# Patient Record
Sex: Female | Born: 1955 | Race: White | Hispanic: No | Smoking: Never smoker
Health system: Southern US, Community
[De-identification: ages and names within clinical notes are randomized; demographics above are authoritative.]

## PROBLEM LIST (undated history)

## (undated) DIAGNOSIS — F32A Depression, unspecified: Secondary | ICD-10-CM

## (undated) DIAGNOSIS — K219 Gastro-esophageal reflux disease without esophagitis: Secondary | ICD-10-CM

## (undated) DIAGNOSIS — F329 Major depressive disorder, single episode, unspecified: Secondary | ICD-10-CM

## (undated) DIAGNOSIS — F419 Anxiety disorder, unspecified: Secondary | ICD-10-CM

## (undated) DIAGNOSIS — Z79890 Hormone replacement therapy (postmenopausal): Secondary | ICD-10-CM

## (undated) DIAGNOSIS — G4730 Sleep apnea, unspecified: Secondary | ICD-10-CM

## (undated) HISTORY — DX: Anxiety disorder, unspecified: F41.9

## (undated) HISTORY — DX: Sleep apnea, unspecified: G47.30

## (undated) HISTORY — DX: Depression, unspecified: F32.A

## (undated) HISTORY — PX: CHOLECYSTECTOMY: SHX55

## (undated) HISTORY — DX: Hormone replacement therapy: Z79.890

## (undated) HISTORY — DX: Major depressive disorder, single episode, unspecified: F32.9

## (undated) HISTORY — DX: Gastro-esophageal reflux disease without esophagitis: K21.9

---

## 1999-10-17 ENCOUNTER — Encounter: Admission: RE | Admit: 1999-10-17 | Discharge: 1999-10-17 | Payer: Self-pay | Admitting: Family Medicine

## 1999-10-17 ENCOUNTER — Encounter: Payer: Self-pay | Admitting: Family Medicine

## 2001-10-15 DIAGNOSIS — Z79890 Hormone replacement therapy (postmenopausal): Secondary | ICD-10-CM

## 2001-10-15 HISTORY — DX: Hormone replacement therapy: Z79.890

## 2002-01-28 ENCOUNTER — Encounter: Payer: Self-pay | Admitting: *Deleted

## 2002-01-28 ENCOUNTER — Ambulatory Visit (HOSPITAL_COMMUNITY): Admission: RE | Admit: 2002-01-28 | Discharge: 2002-01-28 | Payer: Self-pay | Admitting: *Deleted

## 2002-02-19 ENCOUNTER — Encounter: Payer: Self-pay | Admitting: Gastroenterology

## 2002-02-19 ENCOUNTER — Encounter: Admission: RE | Admit: 2002-02-19 | Discharge: 2002-02-19 | Payer: Self-pay | Admitting: Gastroenterology

## 2002-09-07 ENCOUNTER — Encounter: Payer: Self-pay | Admitting: Family Medicine

## 2002-09-07 ENCOUNTER — Encounter: Admission: RE | Admit: 2002-09-07 | Discharge: 2002-09-07 | Payer: Self-pay | Admitting: Family Medicine

## 2003-09-15 ENCOUNTER — Encounter: Admission: RE | Admit: 2003-09-15 | Discharge: 2003-09-15 | Payer: Self-pay | Admitting: Family Medicine

## 2003-12-02 ENCOUNTER — Other Ambulatory Visit: Admission: RE | Admit: 2003-12-02 | Discharge: 2003-12-02 | Payer: Self-pay | Admitting: Family Medicine

## 2004-06-26 ENCOUNTER — Other Ambulatory Visit: Admission: RE | Admit: 2004-06-26 | Discharge: 2004-06-26 | Payer: Self-pay | Admitting: Family Medicine

## 2004-09-19 ENCOUNTER — Encounter: Admission: RE | Admit: 2004-09-19 | Discharge: 2004-09-19 | Payer: Self-pay | Admitting: Family Medicine

## 2004-12-11 ENCOUNTER — Ambulatory Visit: Payer: Self-pay | Admitting: Internal Medicine

## 2004-12-11 ENCOUNTER — Ambulatory Visit (HOSPITAL_BASED_OUTPATIENT_CLINIC_OR_DEPARTMENT_OTHER): Admission: RE | Admit: 2004-12-11 | Discharge: 2004-12-11 | Payer: Self-pay | Admitting: Family Medicine

## 2005-01-17 ENCOUNTER — Other Ambulatory Visit: Admission: RE | Admit: 2005-01-17 | Discharge: 2005-01-17 | Payer: Self-pay | Admitting: Family Medicine

## 2005-08-01 ENCOUNTER — Other Ambulatory Visit: Admission: RE | Admit: 2005-08-01 | Discharge: 2005-08-01 | Payer: Self-pay | Admitting: Family Medicine

## 2005-10-11 ENCOUNTER — Encounter: Admission: RE | Admit: 2005-10-11 | Discharge: 2005-10-11 | Payer: Self-pay | Admitting: Family Medicine

## 2006-01-28 ENCOUNTER — Other Ambulatory Visit: Admission: RE | Admit: 2006-01-28 | Discharge: 2006-01-28 | Payer: Self-pay | Admitting: Family Medicine

## 2006-10-14 ENCOUNTER — Encounter: Admission: RE | Admit: 2006-10-14 | Discharge: 2006-10-14 | Payer: Self-pay | Admitting: Family Medicine

## 2007-02-07 ENCOUNTER — Other Ambulatory Visit: Admission: RE | Admit: 2007-02-07 | Discharge: 2007-02-07 | Payer: Self-pay | Admitting: Family Medicine

## 2007-03-12 ENCOUNTER — Ambulatory Visit (HOSPITAL_BASED_OUTPATIENT_CLINIC_OR_DEPARTMENT_OTHER): Admission: RE | Admit: 2007-03-12 | Discharge: 2007-03-12 | Payer: Self-pay | Admitting: Family Medicine

## 2007-03-23 ENCOUNTER — Ambulatory Visit: Payer: Self-pay | Admitting: Internal Medicine

## 2007-08-12 ENCOUNTER — Encounter: Admission: RE | Admit: 2007-08-12 | Discharge: 2007-08-12 | Payer: Self-pay | Admitting: Internal Medicine

## 2007-10-21 ENCOUNTER — Encounter: Admission: RE | Admit: 2007-10-21 | Discharge: 2007-10-21 | Payer: Self-pay | Admitting: Family Medicine

## 2008-10-25 ENCOUNTER — Ambulatory Visit (HOSPITAL_BASED_OUTPATIENT_CLINIC_OR_DEPARTMENT_OTHER): Admission: RE | Admit: 2008-10-25 | Discharge: 2008-10-25 | Payer: Self-pay | Admitting: Family Medicine

## 2008-10-25 ENCOUNTER — Ambulatory Visit: Payer: Self-pay | Admitting: Diagnostic Radiology

## 2009-02-08 ENCOUNTER — Ambulatory Visit: Payer: Self-pay | Admitting: Family Medicine

## 2009-02-08 DIAGNOSIS — F4322 Adjustment disorder with anxiety: Secondary | ICD-10-CM | POA: Insufficient documentation

## 2009-02-08 DIAGNOSIS — K219 Gastro-esophageal reflux disease without esophagitis: Secondary | ICD-10-CM | POA: Insufficient documentation

## 2009-02-09 ENCOUNTER — Encounter: Payer: Self-pay | Admitting: Family Medicine

## 2009-02-10 ENCOUNTER — Telehealth: Payer: Self-pay | Admitting: Family Medicine

## 2009-03-08 ENCOUNTER — Ambulatory Visit: Payer: Self-pay | Admitting: Family Medicine

## 2009-05-27 ENCOUNTER — Encounter: Payer: Self-pay | Admitting: Family Medicine

## 2009-06-17 ENCOUNTER — Encounter: Payer: Self-pay | Admitting: Family Medicine

## 2009-07-20 ENCOUNTER — Telehealth (INDEPENDENT_AMBULATORY_CARE_PROVIDER_SITE_OTHER): Payer: Self-pay | Admitting: *Deleted

## 2009-10-27 ENCOUNTER — Ambulatory Visit: Payer: Self-pay | Admitting: Diagnostic Radiology

## 2009-10-27 ENCOUNTER — Ambulatory Visit (HOSPITAL_BASED_OUTPATIENT_CLINIC_OR_DEPARTMENT_OTHER): Admission: RE | Admit: 2009-10-27 | Discharge: 2009-10-27 | Payer: Self-pay | Admitting: Family Medicine

## 2009-11-02 ENCOUNTER — Encounter: Admission: RE | Admit: 2009-11-02 | Discharge: 2009-11-02 | Payer: Self-pay | Admitting: Family Medicine

## 2010-02-21 ENCOUNTER — Ambulatory Visit: Payer: Self-pay | Admitting: Family

## 2010-02-21 ENCOUNTER — Encounter (INDEPENDENT_AMBULATORY_CARE_PROVIDER_SITE_OTHER): Payer: Self-pay | Admitting: *Deleted

## 2010-02-21 DIAGNOSIS — G4733 Obstructive sleep apnea (adult) (pediatric): Secondary | ICD-10-CM | POA: Insufficient documentation

## 2010-02-21 DIAGNOSIS — R0789 Other chest pain: Secondary | ICD-10-CM | POA: Insufficient documentation

## 2010-02-21 DIAGNOSIS — F329 Major depressive disorder, single episode, unspecified: Secondary | ICD-10-CM | POA: Insufficient documentation

## 2010-02-21 DIAGNOSIS — F411 Generalized anxiety disorder: Secondary | ICD-10-CM | POA: Insufficient documentation

## 2010-02-21 DIAGNOSIS — R0602 Shortness of breath: Secondary | ICD-10-CM | POA: Insufficient documentation

## 2010-02-22 ENCOUNTER — Encounter: Payer: Self-pay | Admitting: Family

## 2010-03-02 ENCOUNTER — Encounter (INDEPENDENT_AMBULATORY_CARE_PROVIDER_SITE_OTHER): Payer: Self-pay | Admitting: *Deleted

## 2010-03-07 ENCOUNTER — Ambulatory Visit: Payer: Self-pay | Admitting: Gastroenterology

## 2010-03-17 ENCOUNTER — Ambulatory Visit: Payer: Self-pay | Admitting: Gastroenterology

## 2010-03-21 ENCOUNTER — Encounter: Payer: Self-pay | Admitting: Gastroenterology

## 2010-03-22 ENCOUNTER — Encounter: Payer: Self-pay | Admitting: Family

## 2010-03-29 ENCOUNTER — Encounter: Payer: Self-pay | Admitting: Cardiology

## 2010-03-29 ENCOUNTER — Ambulatory Visit: Payer: Self-pay | Admitting: Cardiology

## 2010-04-11 ENCOUNTER — Ambulatory Visit: Payer: Self-pay | Admitting: Pulmonary Disease

## 2010-04-12 ENCOUNTER — Ambulatory Visit: Payer: Self-pay | Admitting: Family

## 2010-04-13 ENCOUNTER — Telehealth: Payer: Self-pay | Admitting: Family

## 2010-04-24 ENCOUNTER — Ambulatory Visit: Payer: Self-pay | Admitting: Cardiology

## 2010-04-24 ENCOUNTER — Ambulatory Visit: Payer: Self-pay

## 2010-04-24 ENCOUNTER — Ambulatory Visit (HOSPITAL_COMMUNITY)
Admission: RE | Admit: 2010-04-24 | Discharge: 2010-04-24 | Payer: Self-pay | Source: Home / Self Care | Admitting: Cardiology

## 2010-04-24 ENCOUNTER — Encounter: Payer: Self-pay | Admitting: Cardiology

## 2010-05-30 ENCOUNTER — Ambulatory Visit: Payer: Self-pay | Admitting: Pulmonary Disease

## 2010-10-27 ENCOUNTER — Telehealth: Payer: Self-pay | Admitting: Family

## 2010-11-01 ENCOUNTER — Ambulatory Visit (HOSPITAL_BASED_OUTPATIENT_CLINIC_OR_DEPARTMENT_OTHER)
Admission: RE | Admit: 2010-11-01 | Discharge: 2010-11-01 | Payer: Self-pay | Source: Home / Self Care | Attending: Gynecology | Admitting: Gynecology

## 2010-11-07 ENCOUNTER — Ambulatory Visit
Admission: RE | Admit: 2010-11-07 | Discharge: 2010-11-07 | Payer: Self-pay | Source: Home / Self Care | Attending: Family | Admitting: Family

## 2010-11-08 ENCOUNTER — Encounter
Admission: RE | Admit: 2010-11-08 | Discharge: 2010-11-08 | Payer: Self-pay | Source: Home / Self Care | Attending: Gynecology | Admitting: Gynecology

## 2010-11-12 LAB — CONVERTED CEMR LAB
Albumin: 3.8 g/dL (ref 3.5–5.2)
Albumin: 4.4 g/dL (ref 3.5–5.2)
Basophils Absolute: 0 10*3/uL (ref 0.0–0.1)
Basophils Relative: 0 % (ref 0–1)
Calcium: 8.9 mg/dL (ref 8.4–10.5)
Calcium: 9.1 mg/dL (ref 8.4–10.5)
Chloride: 105 meq/L (ref 96–112)
Chloride: 111 meq/L (ref 96–112)
Direct LDL: 133.6 mg/dL
HCT: 37.9 % (ref 36.0–46.0)
HCT: 40.9 % (ref 36.0–46.0)
Hemoglobin: 12.7 g/dL (ref 12.0–15.0)
Hemoglobin: 13 g/dL (ref 12.0–15.0)
MCV: 89.9 fL (ref 78.0–100.0)
Monocytes Absolute: 0.4 10*3/uL (ref 0.1–1.0)
Monocytes Relative: 7 % (ref 3–12)
Neutro Abs: 3.1 10*3/uL (ref 1.7–7.7)
Neutrophils Relative %: 55 % (ref 43–77)
Neutrophils Relative %: 56.7 % (ref 43.0–77.0)
Platelets: 186 10*3/uL (ref 150.0–400.0)
Potassium: 3.9 meq/L (ref 3.5–5.1)
Potassium: 4.2 meq/L (ref 3.5–5.3)
RBC: 4.55 M/uL (ref 3.87–5.11)
RDW: 12 % (ref 11.5–14.6)
Sodium: 141 meq/L (ref 135–145)
Sodium: 142 meq/L (ref 135–145)
TSH: 1.82 microintl units/mL (ref 0.35–5.50)
Total Bilirubin: 0.5 mg/dL (ref 0.3–1.2)
Total CHOL/HDL Ratio: 4
Total Protein: 6.7 g/dL (ref 6.0–8.3)
Total Protein: 7.2 g/dL (ref 6.0–8.3)
Triglycerides: 96 mg/dL (ref 0.0–149.0)
VLDL: 24 mg/dL (ref 0–40)
WBC: 4.9 10*3/uL (ref 4.5–10.5)
WBC: 5.5 10*3/uL (ref 4.0–10.5)

## 2010-11-14 NOTE — Assessment & Plan Note (Signed)
Summary: cpx pap pt fasting cx125 blood test requested/mhf   Vital Signs:  Patient profile:   55 year old female Height:      67.6 inches Weight:      178.25 pounds BMI:     27.52 Temp:     98.1 degrees F oral Pulse rate:   72 / minute Pulse rhythm:   regular Resp:     16 per minute BP sitting:   118 / 80  (right arm) Cuff size:   regular  Vitals Entered By: Mervin Kung CMA (Feb 21, 2010 8:48 AM) CC: room 5  Physical. Is Patient Diabetic? No Pain Assessment Patient in pain? no        Primary Care Provider:  Seymour Bars DO  CC:  room 5  Physical..  History of Present Illness: Angel Arellano is a 55 year old female who presents today for a complete physical and to address some ongoing medical concerns.  1) Preventative- Sees Dr. Nicholas Lose (GYN)   LMP 5 years ago-  Dr. Nicholas Lose has been managing HRT.  Diet- reports that diet is fair.  Patietn has gained 40 lbs in last 4 years which she attributes to depression, lack of exercise and increased hours at work. Last tetanus was less than 10 yrs ago per patient, she will call with date.   2) Depression/Anxiety- using alprazolam 3x weekly (being prescibed by Dr. Nicholas Lose).  Notes current severity of depression is 6/10- approached "10" earlier this year. She discontinued citalopram, but notes that she did feel better when she was taking this medication and did not require PRN benzos.  She and her husband have been having marital problems which is contributing to her stress.  Pt reports that they are seeing a therapist together which is helping tremendously.  3) OSA- Patient reports difficulty sleeping due to OSA.  Has CPAP but has difficulty tolerating and as a result has not been wearing regularly.  Patient was being seen by Dr. Laurann Montana at [place].    4) Chest pressure-  R sided, + SOB chronic.  Noticed more recently. + chest pain with exertion.      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  2 grown daughters. One in [place] and one in [place]. No exercise.  Fair diet.  Trying to lose weight. Nonsmoker. Several glasses of wine/beer per week.Smoking Status:  never Caffeine use/day:  1 cup daily Does Patient Exercise:  no  Review of Systems       Constitutional: Denies Fever ENT:  Difficulty breathing through right nare, denies sore throat. Resp: Denies cough CV:  Chest pain "all the time" x last 1 month.  SOB "all the time" x 2 years GI:  Denies nausea or vomitting GU: Denies dysuria Lymphatic: Denies lymphadenopathy Musculoskeletal:  Denies muscle/joint pain Skin:  Denies Rashes Psychiatric: depression see HPI, occasional anxiety at work Neuro: occasional numbness in fingers bilaterally, no weakness     Physical Exam  General:  Well-developed,well-nourished,in no acute distress; alert,appropriate and cooperative throughout  examination Head:  Normocephalic and atraumatic without obvious abnormalities. No apparent alopecia or balding. Eyes:  No corneal or conjunctival inflammation noted. EOMI. Perrla. Funduscopic exam benign, without hemorrhages, exudates or papilledema. Vision grossly normal. Ears:  External ear exam shows no significant lesions or deformities.  Otoscopic examination reveals clear canals, tympanic membranes are intact bilaterally without bulging, retraction, inflammation or discharge. Hearing is grossly normal bilaterally. Nose:  No clear sign of deviated septum Mouth:  Oral mucosa and oropharynx without lesions or exudates.  Teeth in good  repair. Neck:  No deformities, masses, or tenderness noted. Chest Wall:  No deformities, masses, or tenderness noted. Breasts:  No mass, nodules, thickening, tenderness, bulging, retraction, inflamation, nipple discharge or skin changes noted.   Lungs:  Normal respiratory effort, chest expands symmetrically. Lungs are clear to auscultation, no crackles or wheezes. Heart:  Normal rate and regular rhythm. S1 and S2 normal without gallop, murmur, click, rub or other extra sounds. Abdomen:  Bowel sounds positive,abdomen soft and non-tender without masses, organomegaly or hernias noted. Genitalia:  deferred to GYN Msk:  No deformity or scoliosis noted of thoracic or lumbar spine.   Pulses:  posterior tibial pulses are full and equal bilaterallyR radial normal, R dorsalis pedis normal, L radial normal, and L dorsalis pedis normal.   Extremities:  No clubbing, cyanosis, edema, or deformity noted with normal full range of motion of all joints.   Neurologic:  No cranial nerve deficits noted. Station and gait are normal. Plantar reflexes are down-going bilaterally. DTRs are symmetrical throughout. Sensory, motor and coordinative functions appear intact. Skin:  Intact without suspicious lesions or rashes Cervical Nodes:  No lymphadenopathy noted Axillary Nodes:  No palpable lymphadenopathy Psych:  Pleasant, good eye contact, A and O x 3- tearful at times.   Impression & Recommendations:  Problem # 1:  PREVENTIVE HEALTH CARE (ICD-V70.0) Assessment Comment Only Check fasting labs.  Immunizations reviewed and up to date.  Patient counseled on diet, exercise and weight loss.  Up to date on mammogram.  Needs screening colo, will refer to [facility].  Pt instructed to follow up with GYN for HRT and annual pap.  EKG NSR Orders: T-Comprehensive Metabolic Panel [id number]) T-CBC w/Diff ([id number]) T-TSH [id number]) T-Lipid Profile ([id number]) Gastroenterology Referral (GI) EKG w/ Interpretation  (93000)  Problem # 2:  DYSPNEA (ICD-786.05) Assessment: New This is a long standing problem.  I suspect that this is multifactorial in setting of OSA, weight gain and anxiety.  Will check D. Dimer and plant to refer to Pulmonary for f/u of her OSA and cardiology for work up of her Atypical Chest pain and risk factor stratification.  Orders: T-D-Dimer Fibrin Derivatives Quantitive [id number]) Cardiology Referral (Cardiology)  Problem # 3:  DEPRESSION (ICD-311) Assessment: Deteriorated Will resume citalopram at previous dose of 10 mg by mouth daily.  Pt instructed to d/c med and go to ED should she develop suicidal thoughts while taking this med.  She verbalizes understanding.  Plan f/u in 1 month. The following medications were removed from the medication list:    Citalopram Hydrobromide 10 Mg Tabs (Citalopram hydrobromide) .Marland Kitchen... 1 tab by mouth daily Her updated medication list for this problem includes:    Alprazolam 0.5 Mg Tabs (Alprazolam) .Marland Kitchen... Take one tablet daily as needed.    Citalopram Hydrobromide 10 Mg Tabs (Citalopram hydrobromide) ..... One tablet by mouth daily  Problem # 4:  ANXIETY STATE, UNSPECIFIED (ICD-300.00) Assessment: Deteriorated Add Citalopram as for #3.  This should help her anxiety as  well and hopefully she can wean off of alprazolam if her symptoms improve.  The following medications were removed from the medication list:    Citalopram Hydrobromide 10 Mg Tabs (Citalopram hydrobromide) .Marland Kitchen... 1 tab by mouth daily Her updated medication list for this problem includes:    Alprazolam 0.5 Mg Tabs (Alprazolam) .Marland Kitchen... Take one tablet daily as needed.    Citalopram Hydrobromide 10 Mg Tabs (Citalopram hydrobromide) ..... One tablet by mouth daily  Problem # 5:  SLEEP APNEA, OBSTRUCTIVE (ICD-327.23) Assessment: Unchanged  Will refer to Dr. Vassie Loll for further evaluation.  Perhaps her settings could be titrated to comfort or a different mask could be tried which could help  with her compliance.  Patient also notes some difficulty breathing out of her nose- especially on the right.  No clear deviated septum is visible on exam.  Discussed possiblility of referral to ENT to further evaluate (? nasal polyp, ? deviated septum) in setting of OSA.  She would like to postpone this referral at this time.    Orders: Pulmonary Referral (Pulmonary)  Complete Medication List: 1)  Vivelle-dot 0.0375 Mg/24hr Pttw (Estradiol) .... Twice weekly 2)  Prometrium 200 Mg Caps (Progesterone micronized) .... Every 3 months 3)  Estrace 0.1 Mg/gm Crea (Estradiol) .... 2 times weekly 4)  Flovent Diskus 50 Mcg/blist Aepb (Fluticasone propionate (inhal)) .Marland Kitchen.. 1 puff as needed 5)  Omeprazole 40 Mg Cpdr (Omeprazole) .Marland Kitchen.. 1 tab by mouth daily, take 30 min before a meal 6)  Alprazolam 0.5 Mg Tabs (Alprazolam) .... Take one tablet daily as needed. 7)  Premarin 0.625 Mg/gm Crea (Estrogens, conjugated) .... 2-3 times weekly 8)  Centrum Silver Tabs (Multiple vitamins-minerals) .... Take 1 tablet by mouth once a day 9)  Fish Oil Double Strength 1200 Mg Caps (Omega-3 fatty acids) .... Take 1 capsule by mouth once a day 10)  Vitamin D3 1000 Unit Caps (Cholecalciferol) .... Take 2 caps daily 11)  Calcium Citrate 1000 Mg Tabs (calcium Citrate)  .... Take 2 twice a day 12)  Aspir-low 81 Mg Tbec (Aspirin) .... Take 1 tablet by mouth once a day 13)  Citalopram Hydrobromide 10 Mg Tabs (Citalopram hydrobromide) .... One tablet by mouth daily  Patient Instructions: 1)  You will be contacted about your referral to Pulmonology and Cardiology 2)  It is important that you exercise regularly at least 20 minutes 5 times a week. If you develop chest pain, have severe difficulty breathing, or feel very tired , stop exercising immediately and seek medical attention. 3)  You need to lose weight. Consider a lower calorie diet and regular exercise.  4)  Please follow up in 1 month. 5)  It was a pleasure to meet  you! Prescriptions: CITALOPRAM HYDROBROMIDE 10 MG TABS (CITALOPRAM HYDROBROMIDE) one tablet by mouth daily  #30 x 1   Entered and Authorized by:   Lemont Fillers FNP   Signed by:   Lemont Fillers FNP on 02/21/2010   Method used:   Electronically to        [facility] [address] (retail)       [address] [place], [place]  [address]       Ph: [phone] or [phone]       Fax: [phone]   RxID:   [id number]   Current Allergies (reviewed today): No known allergies

## 2010-11-14 NOTE — Procedures (Signed)
Summary: Colonoscopy  Patient: Angel Arellano Note: All result statuses are Final unless otherwise noted.  Tests: (1) Colonoscopy (COL)   COL Colonoscopy           DONE     [facility]     [address]     [place], [place]  [address]           COLONOSCOPY PROCEDURE REPORT           PATIENT:  [name], [name]  MR#:  [id number]     BIRTHDATE:  12-30-1955, 53 yrs. old  GENDER:  female     ENDOSCOPIST:  Rachael Fee, MD     REF. BY:  Sandford Craze, FNP     PROCEDURE DATE:  03/17/2010     PROCEDURE:  Colonoscopy with snare polypectomy     ASA CLASS:  Class II     INDICATIONS:  Routine Risk Screening     MEDICATIONS:   Fentanyl 75 mcg IV, Versed 8 mg IV           DESCRIPTION OF PROCEDURE:   After the risks benefits and     alternatives of the procedure were thoroughly explained, informed     consent was obtained.  Digital rectal exam was performed and     revealed no rectal masses.   The LB [id number] endoscope     was introduced through the anus and advanced to the cecum, which     was identified by both the appendix and ileocecal valve, without     limitations.  The quality of the prep was good, using MiraLax.     The instrument was then slowly withdrawn as the colon was fully     examined.     <<PROCEDUREIMAGES>>     FINDINGS:  A sessile polyp was found in the rectum. This was 4mm     across, removed with cold snare and sent to pathology (jar 1) (see     image4).  This was otherwise a normal examination of the colon     (see image3, image2, and image7).   Retroflexed views in the     rectum revealed no abnormalities.    The scope was then withdrawn     from the patient and the procedure completed.           COMPLICATIONS:  None           ENDOSCOPIC IMPRESSION:     1) Small  polyp in the rectum, removed and sent to pathology     2) Otherwise normal examination           RECOMMENDATIONS:     1) If the polyp removed today is proven to be an adenomatous     (pre-cancerous) polyp, you will need a repeat colonoscopy in 5     years. Otherwise you should continue to follow colorectal cancer     screening guidelines for "routine risk" patients with colonoscopy     in 10 years.     2) You will receive a letter within 1-2 weeks with the results     of your biopsy as well as final recommendations. Please call my     office if you have not received a letter after 3 weeks.           ______________________________     Rachael Fee, MD           n.     Rosalie DoctorReuel Boom  Marye Round at 03/17/2010 12:05 PM           Mervin Kung, [phone]  Note: An exclamation mark (!) indicates a result that was not dispersed into the flowsheet. Document Creation Date: 03/17/2010 12:05 PM _______________________________________________________________________  (1) Order result status: Final Collection or observation date-time: 03/17/2010 12:01 Requested date-time:  Receipt date-time:  Reported date-time:  Referring Physician:   Ordering Physician: Rob Bunting [phone]) Specimen Source:  Source: Launa Grill Order Number: [id number] Lab site:   Appended Document: Colonoscopy recall     Procedures Next Due Date:    Colonoscopy: 03/2020

## 2010-11-14 NOTE — Assessment & Plan Note (Signed)
Summary: 1 month follow up/mhf rsc with pt/mhf--Rm 4   Vital Signs:  Patient profile:   55 year old female Height:      67.6 inches Weight:      178 pounds BMI:     27.49 Temp:     98.3 degrees F oral Pulse rate:   72 / minute Pulse rhythm:   regular Resp:     14 per minute BP standing:   76 /  Cuff size:   large  Vitals Entered By: Mervin Kung CMA (April 12, 2010 8:01 AM) CC: Room 4  1 month follow up. Pt would like to have CA-125 blood test.  Would like to know if Omeprazole alternatives have the same side effects.  Would like info on PAD. Is Patient Diabetic? No   Primary Care Provider:  Seymour Bars DO  CC:  Room 4  1 month follow up. Pt would like to have CA-125 blood test.  Would like to know if Omeprazole alternatives have the same side effects.  Would like info on PAD.Marland Kitchen  History of Present Illness: Angel Arellano is a 55 year old female who presents today for follow up.  1) Depression/Anxiety- notes significant improvement since starting Citalopram.  She continues to use Alprazolam once daily.    2)GERD- well controlled, wants to cut back on omeprazole.  3)Chest pain- saw Dr. Jens Som, scheduled for stress echo.  4) OSA- to start new CPAP mask/settings per Dr. Vassie Loll  5)CA-125- she knows someone who has ovarian CA and is now on hospice, wondering if she could have this checked.  Allergies: 1)  ! 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  2 grown daughters. One in [place] and one in [place]. No exercise.  Fair diet.   Trying to lose weight. Nonsmoker. Several glasses of wine/beer per week.  Risk Factors: Alcohol Use: 2-3 per week (02/21/2010) Caffeine Use: 1 cup daily (02/21/2010) Exercise: no (02/21/2010)  Risk Factors: Smoking Status: never (02/21/2010)  Physical Exam  General:  Well-developed,well-nourished,in no acute distress; alert,appropriate and cooperative throughout examination Head:  Normocephalic and atraumatic without obvious abnormalities. No apparent alopecia or balding. Lungs:  Normal respiratory effort, chest expands symmetrically. Lungs are clear to auscultation, no crackles or wheezes. Heart:  Normal rate and regular rhythm. S1 and S2 normal without gallop, murmur, click, rub or other extra sounds. Psych:  Oriented X3, normally interactive, and slightly anxious.     Impression & Recommendations:  Problem # 1:  DEPRESSION (ICD-311) Improved on celexa.  she has been taking alprazolam once daily in the AM.  Suggested that she change to a as needed basis now that she is on the celexa.  Did tell patient that we would check on the cost of the CA-125 as this testing is unlikely to be covered by insurance.   Her updated medication list for this problem includes:    Alprazolam 0.5 Mg Tabs (Alprazolam) .Marland Kitchen... Take one tablet daily as needed.    Celexa 10 Mg Tabs (Citalopram hydrobromide) ..... One tablet by mouth daily  Problem # 2:  ACID REFLUX DISEASE (  ICD-530.81) Assessment: Improved She wants to cut back on omeprazole, was on 40mg , recommended that she start Prilosec otc 20mg . Her updated medication list for this problem includes:    Omeprazole 20 Mg Tbec (Omeprazole) ..... One tab by mouth daily  Problem # 3:  CHEST PAIN, ATYPICAL (ICD-786.59) Assessment: Comment Only Saw Dr. Jens Som- felt to be atypical, she is scheduled for stess testing.  Problem # 4:  SLEEP APNEA, OBSTRUCTIVE (ICD-327.23) Assessment: Comment Only Saw Dr. Vassie Loll, being started on a new mask.   Complete  Medication List: 1)  Vivelle-dot 0.0375 Mg/24hr Pttw (Estradiol) .... Twice weekly 2)  Prometrium 200 Mg Caps (Progesterone micronized) .... Every 3 months 3)  Omeprazole 20 Mg Tbec (Omeprazole) .... One tab by mouth daily 4)  Alprazolam 0.5 Mg Tabs (Alprazolam) .... Take one tablet daily as needed. 5)  Premarin 0.625 Mg/gm Crea (Estrogens, conjugated) .... 2-3 times weekly 6)  Centrum Silver Tabs (Multiple vitamins-minerals) .... Take 1 tablet by mouth once a day 7)  Fish Oil Double Strength 1200 Mg Caps (Omega-3 fatty acids) .... Take 1 capsule by mouth once a day 8)  Vitamin D3 1000 Unit Caps (Cholecalciferol) .... Take 2 caps daily 9)  Calcium Citrate 1000 Mg Tabs (calcium Citrate)  .... Take 2 twice a day 10)  Aspir-low 81 Mg Tbec (Aspirin) .... Take 1 tablet by mouth once a day 11)  Celexa 10 Mg Tabs (Citalopram hydrobromide) .... One tablet by mouth daily  Patient Instructions: 1)  Please continue to work hard on diet, exercise and weight loss. 2)  Please schedule a follow-up appointment in 3 months. 3)  Have a nice summer! Prescriptions: CELEXA 10 MG TABS (CITALOPRAM HYDROBROMIDE) one tablet by mouth daily  #30 x 5   Entered and Authorized by:   Lemont Fillers FNP   Signed by:   Lemont Fillers FNP on 04/12/2010   Method used:   Electronically to        [facility] [address] (retail)       [address]       [place], [place]  [address]       Ph: [phone] or [phone]       Fax: [phone]   RxID:   [id number]   Current Allergies (reviewed today): ! CODEINE

## 2010-11-14 NOTE — Letter (Signed)
Summary: Previsit letter  [facility]  [address] [place], [place] [address]   Phone: [phone]  Fax: [phone]       02/21/2010 MRN: [id number]  Angel Arellano [address] [place], [place]  [address]  Dear [facility],  Welcome to the Gastroenterology Division at [facility].    You are scheduled to see a nurse for your pre-procedure visit on 03-03-10 at 10:30AM on the 3rd floor at [facility], [address]  We ask that you try to arrive at our office 15 minutes prior to your appointment time to allow for check-in.  Your nurse visit will consist of discussing your medical and surgical history, your immediate family medical history, and your medications.    Please bring a complete list of all your medications or, if you prefer, bring the medication bottles and we will list them.  We will need to be aware of both prescribed and over the counter drugs.  We will need to know exact dosage information as well.  If you are on blood thinners (Coumadin, Plavix, Aggrenox, Ticlid, etc.) please call our office today/prior to your appointment, as we need to consult with your physician about holding your medication.   Please be prepared to read and sign documents such as consent forms, a financial agreement, and acknowledgement forms.  If necessary, and with your consent, a friend or relative is welcome to sit-in on the nurse visit with you.  Please bring your insurance card so that we may make a copy of it.  If your insurance requires a referral to see a specialist, please bring your referral form from your primary care physician.  No co-pay is required for this nurse visit.     If you cannot keep your appointment, please call [phone] to cancel or reschedule prior to your appointment date.  This allows [place] the opportunity to schedule an appointment for another patient in need of care.    Thank you for choosing [facility] for your medical needs.   We appreciate the opportunity to care for you.  Please visit [place] at our website  to learn more about our practice.                     Sincerely.                                                                                                                   The Gastroenterology Division

## 2010-11-14 NOTE — Assessment & Plan Note (Signed)
Summary: SLEEP CONSULT- [place] OFFICE   Primary Provider/Referring Provider:  Seymour Bars DO   History of Present Illness: 53/F for evaluation of excessive tiredness & obstructive sleep apnea. TSH has checked nml. She is working 10 hr days & has gained 25 lbs in thelast few years due to sedentary lifestyle. Epworth Sleepiness Score is 7. Bedtime is 11 pm, she drinks a glass of wine with dinner around 8p, sleep latency is minimal, she has 5-10 awakenings through the night & about once/ wk stays up x 1 hr watching TV. She awakens at 0530 feeling tired, with dryness of mouth, no headache. A cup of coffee gets hher going, tea during day, no sodas.  She reports menopausal symptoms over last 4 yrs relieved by HRT. She has tried [place] but had residual tiredness in the daytime.  There is no history suggestive of cataplexy, sleep paralysis or parasomnias  Overnight PSg in 2/06, wt 170 lbs showed AHi of 12/h with nadir desatn 84% CPAP titration in 5/08 , wt 174 lbs required 7 cm. She did not tolerate a full face mask due to dryness. Apneas have been witnessed by husband.  She is undergoing cardiac evaln by dr Jens Som.    What time do you typically go to bed?(between what hours): 10-11 pm  How long does it take you to fall asleep? 5 minutes  How many times during the night do you wake up? 5-10  What time do you get out of bed to start your day? 5:30 AM  Do you drive or operate heavy machinery in your occupation? no  How much has your weight changed (up or down) over the past two years? (in pounds): up to 25lb  Have you ever had a sleep study before?  If yes,when and where: yes, [facility] 3-27yrs ago  Do you currently use CPAP ? If so , at what pressure? have a CPAP but don't use it  Do you wear oxygen at any time? If yes, how many liters per minute? no Current Medications (verified): 1)  Vivelle-Dot 0.0375 Mg/24hr Pttw (Estradiol) .... Twice Weekly 2)  Prometrium 200 Mg Caps (Progesterone Micronized)  .... Every 3 Months 3)  Omeprazole 40 Mg Cpdr (Omeprazole) .Marland Kitchen.. 1 Tab By Mouth Daily, Take 30 Min Before A Meal 4)  Alprazolam 0.5 Mg Tabs (Alprazolam) .... Take One Tablet Daily As Needed. 5)  Premarin 0.625 Mg/gm Crea (Estrogens, Conjugated) .... 2-3 Times Weekly 6)  Centrum Silver  Tabs (Multiple Vitamins-Minerals) .... Take 1 Tablet By Mouth Once A Day 7)  Fish Oil Double Strength 1200 Mg Caps (Omega-3 Fatty Acids) .... Take 1 Capsule By Mouth Once A Day 8)  Vitamin D3 1000 Unit Caps (Cholecalciferol) .... Take 2 Caps Daily 9)  Calcium Citrate 1000 Mg Tabs (Calcium Citrate) .... Take 2 Twice A Day 10)  Aspir-Low 81 Mg Tbec (Aspirin) .... Take 1 Tablet By Mouth Once A Day  Allergies: 1)  ! Codeine  Past History:  Past Medical History: Last updated: 03/29/2010 HRT since 2003 G2P2 menopause in 2006 ANXIETY  SLEEP APNEA DEPRESSION GERD  Past Surgical History: Last updated: 02/08/2009 none  Family History: Reviewed history from 03/29/2010 and no changes required. 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  2 grown daughters. One in [place] and  one in [place]. No exercise.  Fair diet.  Trying to lose weight. Nonsmoker. Several glasses of wine/beer per week.  Review of Systems       The patient complains of shortness of breath with activity, shortness of breath at rest, non-productive cough, chest pain, irregular heartbeats, acid heartburn, weight change, headaches, nasal congestion/difficulty breathing through nose, ear ache, anxiety, depression, and joint stiffness or pain.  The patient denies productive cough, coughing up blood, indigestion, loss of appetite, abdominal pain, difficulty swallowing, sore throat, tooth/dental problems, sneezing,  itching, hand/feet swelling, rash, change in color of mucus, and fever.    Vital Signs:  Patient profile:   55 year old female Height:      67.6 inches Weight:      178.31 pounds BMI:     27.53 O2 Sat:      97 % on Room air Temp:     98.3 degrees F oral Pulse rate:   74 / minute BP sitting:   110 / 74  (left arm) Cuff size:   regular  Vitals Entered By: Boone Master CNA/MA (April 11, 2010 9:32 AM)  O2 Flow:  Room air Comments Medications reviewed with patient Daytime contact number verified with patient. Boone Master CNA/MA  April 11, 2010 9:33 AM    Physical Exam  Additional Exam:  Gen. Pleasant, well-nourished, in no distress, normal affect ENT - no lesions, no post nasal drip, class 2 airway Neck: No JVD, no thyromegaly, no carotid bruits Lungs: no use of accessory muscles, no dullness to percussion, clear without rales or rhonchi  Cardiovascular: Rhythm regular, heart sounds  normal, no murmurs or gallops, no peripheral edema Abdomen: soft and non-tender, no hepatosplenomegaly, BS normal. Musculoskeletal: No deformities, no cyanosis or clubbing Neuro:  alert, non focal     Impression & Recommendations:  Problem # 1:  SLEEP APNEA, OBSTRUCTIVE (ICD-327.23) The pathophysiology of obstructive sleep apnea, it's cardiovascular consequences and modes of treatment including CPAP were discussed with the patient in great detail.  Clearly weight loss should be the long eterm goal here.  Trial of CPAP 7 cm with nasal pillows & download - use saline drops to prevent dryness+ humidity Will consider melatonin if continues to have problems with sleep maintenance  Orders: DME Referral (DME) Consultation Level IV (66063)  Patient Instructions: 1)  Copy sent to: Melissa 2)  Please schedule a follow-up appointment in 1 month. 3)  Trial of CPAP with nasal pillows 4)  Use vaseline & saline drops at bedtime 5)  Start on an exercise program 30-60 mins at least 3 times/ wk

## 2010-11-14 NOTE — Letter (Signed)
   [place] [facility] [address] [place], [place]  [address]  [place] Phone: [phone]      Feb 22, 2010   [facility] [address] [facility] [address] [place], [place] [address]  RE:  LAB RESULTS  Dear  Ms. [facility],  The following is an interpretation of your most recent lab tests.  Please take note of any instructions provided or changes to medications that have resulted from your lab work.  ELECTROLYTES:  Good - no changes needed  KIDNEY FUNCTION TESTS:  Good - no changes needed  LIVER FUNCTION TESTS:  Good - no changes needed  Health professionals look at cholesterol as more involved than just the total cholesterol. We consider the level of LDL (bad) cholesterol, HDL (good), cholesterol, and Triglycerides (Grease) in the blood.  1. Your LDL should be under 100, and the HDL should be over 45, if you have any vascular disease such as heart attack, angina, stroke, TIA (mini stroke), claudication (pain in the legs when you walk due to poor circulation),  Abdominal Aortic Aneurysm (AAA), diabetes or prediabetes.  2. Your LDL should be under 130 if you have any two of the following:     a. Smoke or chew tobacco,     b. High blood pressure (if you are on medication or over 140/90 without medication),     c. Female gender,    d. HDL below 40,    e. A female relative (father, brother, or son), who have had any vascular event          as described in #1. above under the age of 55, or a female relative (mother,       sister, or daughter) who had an event as described above under age 77. (An HDL over 60 will subtract one risk factor from the total, so if you have two items in # 2 above, but an HDL over 60, you then fall into category # 3 below).  3. Your LDL should be under 160 if you have any one of the above.  Triglycerides should be under 200 with the ideal being under 150.  For diabetes or pre-diabetes, the ideal HgbA1C should be under 6.0%.  If you fall into any of the above  categories, you should make a follow up appointment to discuss this with your physician.  LIPID PANEL:  Fair - review at your next visit Triglyceride: 118   Cholesterol: 228   LDL: 131   HDL: 73   Chol/HDL%:  3.1 Ratio  THYROID STUDIES:  Thyroid studies normal TSH: 1.547    The D Dimer test which screens for Blood clot in the lungs was normal.  Please work hard on a low cholesterol diet.  We will plan to repeat your cholesterol in 3 months   Sincerely Yours,    Lemont Fillers FNP

## 2010-11-14 NOTE — Letter (Signed)
Summary: Results Letter  [facility]  [address] [place], [place] [address]   Phone: [phone]  Fax: [phone]        March 21, 2010 MRN: [id number]    Angel Arellano [address] [place], [place]  [address]    Dear [name],   Good news.  The polyp(s) that were removed during your recent procedure were NOT pre-cancerous.  You should continue to follow current colorectal cancer screening guidelines with a repeat colonoscopy in 10 years.  We will therefore put your information in our reminder system and will contact you in 10 years to schedule a repeat procedure.  Please call if you have any questions or concerns.       Sincerely,  Rachael Fee MD  This letter has been electronically signed by your physician.  Appended Document: Results Letter letter mailed.

## 2010-11-14 NOTE — Letter (Signed)
Summary: [facility] Instructions  [facility]  [address] [place], [place] [address]   Phone: [phone]  Fax: [phone]       Angel Arellano    1956/09/20    MRN: [id number]        Procedure Day /Date:  03/17/10   Friday     Arrival Time:  10:30am      Procedure Time:  11:30am     Location of Procedure:                    _x _  [facility] (4th Floor)   PREPARATION FOR COLONOSCOPY WITH MOVIPREP   Starting 5 days prior to your procedure _ 5/29/11_ do not eat nuts, seeds, popcorn, corn, beans, peas,  salads, or any raw vegetables.  Do not take any fiber supplements (e.g. Metamucil, Citrucel, and Benefiber).  THE DAY BEFORE YOUR PROCEDURE         DATE:   03/16/10  DAY:   Thursday  1.  Drink clear liquids the entire day-NO SOLID FOOD  2.  Do not drink anything colored red or purple.  Avoid juices with pulp.  No orange juice.  3.  Drink at least 64 oz. (8 glasses) of fluid/clear liquids during the day to prevent dehydration and help the prep work efficiently.  CLEAR LIQUIDS INCLUDE: Water Jello Ice Popsicles Tea (sugar ok, no milk/cream) Powdered fruit flavored drinks Coffee (sugar ok, no milk/cream) Gatorade Juice: apple, white grape, white cranberry  Lemonade Clear bullion, consomm, broth Carbonated beverages (any kind) Strained chicken noodle soup Hard Candy                             4.  In the morning, mix first dose of MoviPrep solution:    Empty 1 Pouch A and 1 Pouch B into the disposable container    Add lukewarm drinking water to the top line of the container. Mix to dissolve    Refrigerate (mixed solution should be used within 24 hrs)  5.  Begin drinking the prep at 5:00 p.m. The MoviPrep container is divided by 4 marks.   Every 15 minutes drink the solution down to the next mark (approximately 8 oz) until the full liter is complete.   6.  Follow completed prep with 16 oz of clear liquid of your choice (Nothing red or  purple).  Continue to drink clear liquids until bedtime.  7.  Before going to bed, mix second dose of MoviPrep solution:    Empty 1 Pouch A and 1 Pouch B into the disposable container    Add lukewarm drinking water to the top line of the container. Mix to dissolve    Refrigerate  THE DAY OF YOUR PROCEDURE      DATE:   03/17/10  DAY:   Friday  Beginning at   6:30a.m. (5 hours before procedure):         1. Every 15 minutes, drink the solution down to the next mark (approx 8 oz) until the full liter is complete.  2. Follow completed prep with 16 oz. of clear liquid of your choice.    3. You may drink clear liquids until  9:30am  (2 HOURS BEFORE PROCEDURE).   MEDICATION INSTRUCTIONS  Unless otherwise instructed, you should take regular prescription medications with a small sip of water   as early as possible the morning of your procedure.  OTHER INSTRUCTIONS  You will need a responsible adult at least 54 years of age to accompany you and drive you home.   This person must remain in the waiting room during your procedure.  Wear loose fitting clothing that is easily removed.  Leave jewelry and other valuables at home.  However, you may wish to bring a book to read or  an iPod/MP3 player to listen to music as you wait for your procedure to start.  Remove all body piercing jewelry and leave at home.  Total time from sign-in until discharge is approximately 2-3 hours.  You should go home directly after your procedure and rest.  You can resume normal activities the  day after your procedure.  The day of your procedure you should not:   Drive   Make legal decisions   Operate machinery   Drink alcohol   Return to work  You will receive specific instructions about eating, activities and medications before you leave.    The above instructions have been reviewed and explained to me by   Wyona Almas RN  Mar 07, 2010 8:26 AM     I fully understand and can  verbalize these instructions _____________________________ Date _________

## 2010-11-14 NOTE — Assessment & Plan Note (Signed)
Summary: 1 month follow up [place]///[facility]   Visit Type:  Follow-up Primary Provider/Referring Provider:  Seymour Bars DO  CC:  Angel Arellano here for follow up.  History of Present Illness: [name] for evaluation of excessive tiredness & obstructive sleep apnea. TSH has checked nml. She is working 10 hr days & has gained 25 lbs in thelast few years due to sedentary lifestyle. Epworth Sleepiness Score is 7. Bedtime is 11 pm, she drinks a glass of wine with dinner around 8p, sleep latency is minimal, she has 5-10 awakenings through the night & about once/ wk stays up x 1 hr watching TV. She awakens at 0530 feeling tired, with dryness of mouth, no headache. A cup of coffee gets hher going, tea during day, no sodas.  She reports menopausal symptoms over last 4 yrs relieved by HRT. She has tried [place] but had residual tiredness in the daytime.  There is no history suggestive of cataplexy, sleep paralysis or parasomnias  Overnight PSg in 2/06, wt 170 lbs showed AHi of 12/h with nadir desatn 84% CPAP titration in 5/08 , wt 174 lbs required 7 cm. She did not tolerate a full face mask due to dryness. Apneas have been witnessed by husband.  Cardiac evaln by dr Jens Som nml.  May 30, 2010 2:38 PM  CPAP helping, feeling more refreshed, using more - getting used to it. Pressure ok, mask ok, no impression on face, mouth & nose dry in am  Preventive Screening-Counseling & Management  Alcohol-Tobacco     Alcohol drinks/day: 2-3 per week     Alcohol type: beer/wine     Smoking Status: never  Current Medications (verified): 1)  Vivelle-Dot 0.0375 Mg/24hr Pttw (Estradiol) .... Twice Weekly 2)  Prometrium 200 Mg Caps (Progesterone Micronized) .... Every 3 Months 3)  Omeprazole 20 Mg Tbec (Omeprazole) .... One Tab By Mouth Daily 4)  Alprazolam 0.5 Mg Tabs (Alprazolam) .... Take One Tablet Daily As Needed. 5)  Premarin 0.625 Mg/gm Crea (Estrogens, Conjugated) .... 2-3 Times Weekly 6)  Centrum Silver  Tabs (Multiple  Vitamins-Minerals) .... Take 1 Tablet By Mouth Once A Day 7)  Fish Oil Double Strength 1200 Mg Caps (Omega-3 Fatty Acids) .... Take 1 Capsule By Mouth Once A Day 8)  Vitamin D3 1000 Unit Caps (Cholecalciferol) .... Take 2 Caps Daily 9)  Calcium Citrate 1000 Mg Tabs (Calcium Citrate) .... Take 2 Twice A Day 10)  Aspir-Low 81 Mg Tbec (Aspirin) .... Take 1 Tablet By Mouth Once A Day 11)  Celexa 10 Mg Tabs (Citalopram Hydrobromide) .... One Tablet By Mouth Daily  Allergies (verified): 1)  ! 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 One in [place] and one in [place]. No exercise.  Fair diet.  Trying to lose weight. Nonsmoker. Several glasses of wine/beer per week.  Review of Systems  The patient denies anorexia, fever, weight loss, weight gain, vision loss, decreased hearing, hoarseness, chest pain, syncope, dyspnea on exertion, peripheral edema, prolonged cough, headaches, hemoptysis, abdominal pain, melena, hematochezia, severe indigestion/heartburn, hematuria, muscle weakness, suspicious skin lesions, difficulty walking, depression, unusual weight change, and abnormal bleeding.    Vital Signs:  Patient profile:   55 year old female Height:      67.6 inches Weight:      182 pounds O2 Sat:  100 % on Room air Temp:     98.4 degrees F oral Pulse rate:   71 / minute BP sitting:   110 / 72  (left arm) Cuff size:   large  Vitals Entered By: Zackery Barefoot CMA (May 30, 2010 2:30 PM)  O2 Flow:  Room air CC: Angel Arellano here for follow up Comments Medications reviewed with patient Verified contact number and pharmacy with patient Zackery Barefoot CMA  May 30, 2010 2:31 PM    Physical Exam  Additional Exam:  Gen. Pleasant, well-nourished, in no distress, normal  affect ENT - no lesions, no post nasal drip, class 2 airway Neck: No JVD, no thyromegaly, no carotid bruits Lungs: no use of accessory muscles, no dullness to percussion, clear without rales or rhonchi  Cardiovascular: Rhythm regular, heart sounds  normal, no murmurs or gallops, no peripheral edema Musculoskeletal: No deformities, no cyanosis or clubbing     Impression & Recommendations:  Problem # 1:  SLEEP APNEA, OBSTRUCTIVE (ICD-327.23)  Will continue CPAP since it does seem to be helping. Long term goal is wt loss ct CPAP 7 cm Compliance encouraged, wt loss emphasized, asked to avoid meds with sedative side effects, cautioned against driving when sleepy.   Orders: Est. Patient Level III (45409)  Patient Instructions: 1)  Please schedule a follow-up appointment in 6 months. 2)  Turn in the chip , I can review & give you feedback

## 2010-11-14 NOTE — Progress Notes (Signed)
Summary: CA-125 cost   --- Converted from flag ---- ---- 04/12/2010 8:13 AM, Lemont Fillers FNP wrote: Could you pls check with lab to see cost of CA-125 and call patient?  Thanks ------------------------------  Per Loney Loh, cost of test is 153.75.  Notified pt. of cost and pt states she will think about it and call us back if she decides to have test.  Mervin Kung CMA  April 13, 2010 9:42 AM

## 2010-11-14 NOTE — Miscellaneous (Signed)
Summary: [facility] Previsit/prep  Clinical Lists Changes  Medications: Added new medication of MOVIPREP 100 GM  SOLR (PEG-KCL-NACL-NASULF-NA ASC-C) As per prep instructions. - Signed Rx of MOVIPREP 100 GM  SOLR (PEG-KCL-NACL-NASULF-NA ASC-C) As per prep instructions.;  #1 x 0;  Signed;  Entered by: Wyona Almas RN;  Authorized by: Rachael Fee MD;  Method used: Electronically to [facility] [address] [place], [place], [place]  [address], Ph: [phone] or [phone], Fax: [phone] Allergies: Added new allergy or adverse reaction of CODEINE Observations: Added new observation of NKA: F (03/07/2010 7:48)    Prescriptions: MOVIPREP 100 GM  SOLR (PEG-KCL-NACL-NASULF-NA ASC-C) As per prep instructions.  #1 x 0   Entered by:   Wyona Almas RN   Authorized by:   Rachael Fee MD   Signed by:   Wyona Almas RN on 03/07/2010   Method used:   Electronically to        [facility] [address] (retail)       [address]       [place], [place]  [address]       Ph: [phone] or [phone]       Fax: [phone]   RxID:   [id number]

## 2010-11-14 NOTE — Assessment & Plan Note (Signed)
Summary: [facility]   Visit Type:  Initial Consult Primary Provider:  Seymour Bars DO  CC:  chest pain.  History of Present Illness: 55 year old female for evaluation of chest pain. Note previous exercise treadmill in April 2010 and performed in [facility] was normal. Also note d-dimer, hemoglobin, liver functions and renal function were normal in May 2011. The patient has no prior cardiac history. Over the past 3 years she has had intermittent chest pain. This can occur in the right upper chest area but also occasionally in the substernal area. She notices this typically with stressful situations. It does not radiate. It is described as a sharp pain. There is no associated nausea, shortness of breath or diaphoresis. It is not pleuritic, positional or related to food. It lasts one to 2 minutes and resolved spontaneously. She does not have exertional chest pain. She has noted increased dyspnea on exertion recently but attributes this to increased weight. There is no orthopnea or pedal edema. Note she does have sleep apnea and is scheduled to see a pulmonologist. Because of her chest pain we were asked to further evaluate.  Current Medications (verified): 1)  Vivelle-Dot 0.0375 Mg/24hr Pttw (Estradiol) .... Twice Weekly 2)  Prometrium 200 Mg Caps (Progesterone Micronized) .... Every 3 Months 3)  Estrace 0.1 Mg/gm Crea (Estradiol) .... 2 Times Weekly 4)  Flovent Diskus 50 Mcg/blist Aepb (Fluticasone Propionate (Inhal)) .Marland Kitchen.. 1 Puff As Needed 5)  Omeprazole 40 Mg Cpdr (Omeprazole) .Marland Kitchen.. 1 Tab By Mouth Daily, Take 30 Min Before A Meal 6)  Alprazolam 0.5 Mg Tabs (Alprazolam) .... Take One Tablet Daily As Needed. 7)  Premarin 0.625 Mg/gm Crea (Estrogens, Conjugated) .... 2-3 Times Weekly 8)  Centrum Silver  Tabs (Multiple Vitamins-Minerals) .... Take 1 Tablet By Mouth Once A Day 9)  Fish Oil Double Strength 1200 Mg Caps (Omega-3 Fatty Acids) .... Take 1 Capsule By Mouth Once A Day 10)  Vitamin D3  1000 Unit Caps (Cholecalciferol) .... Take 2 Caps Daily 11)  Calcium Citrate 1000 Mg Tabs (Calcium Citrate) .... Take 2 Twice A Day 12)  Aspir-Low 81 Mg Tbec (Aspirin) .... Take 1 Tablet By Mouth Once A Day  Allergies: 1)  ! Codeine  Past History:  Past Medical History: HRT since 2003 G2P2 menopause in 2006 ANXIETY  SLEEP APNEA DEPRESSION GERD  Past Surgical History: Reviewed history from 02/08/2009 and no changes required. none  Family History: Reviewed history from 02/21/2010 and no changes required. 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  2 grown daughters. One in [place] and one in [place]. No exercise.  Fair diet.  Trying to lose weight. Nonsmoker. Several glasses of wine/beer per week.  Review of Systems       no fevers or chills, productive cough, hemoptysis, dysphasia, odynophagia, melena, hematochezia, dysuria, hematuria, rash, seizure activity, orthopnea, PND, pedal edema, claudication. Remaining systems are negative.   Vital Signs:  Patient profile:   55 year old female Height:      67.6 inches Weight:      177.50 pounds BMI:     27.41 Pulse rate:   68 / minute Pulse rhythm:   regular Resp:     18 per minute BP sitting:   106 / 80  (left arm) Cuff size:  large  Vitals Entered By: Vikki Ports (March 29, 2010 10:39 AM)  Physical Exam  General:  Well developed/well nourished in NAD Skin warm/dry Patient not depressed; anxious appearing No peripheral clubbing Back-normal HEENT-normal/normal eyelids Neck supple/normal carotid upstroke bilaterally; no bruits; no JVD; no thyromegaly chest - CTA/ normal expansion CV - RRR/normal S1 and S2; no murmurs, rubs or gallops;  PMI nondisplaced Abdomen -NT/ND, no HSM, no  mass, + bowel sounds, no bruit 2+ femoral pulses, no bruits Ext-no edema, chords, 2+ DP Neuro-grossly nonfocal     EKG  Procedure date:  02/21/2010  Findings:      Normal sinus rhythm with no ST changes.  Impression & Recommendations:  Problem # 1:  CHEST PAIN, ATYPICAL (ICD-786.59) Symptoms atypical but patient very concerned. Schedule stress echocardiogram for risk stratification; if normal no further cardiac W/U. Her updated medication list for this problem includes:    Aspir-low 81 Mg Tbec (Aspirin) .Marland Kitchen... Take 1 tablet by mouth once a day  Orders: Stress Echo (Stress Echo)  Problem # 2:  ANXIETY STATE, UNSPECIFIED (ICD-300.00)  Problem # 3:  SLEEP APNEA, OBSTRUCTIVE (ICD-327.23) Management per pulmonary.  Problem # 4:  DYSPNEA (ICD-786.05) Stress echocardiogram will also help quantitate LV function; note recent D-Dimer normal. Her updated medication list for this problem includes:    Aspir-low 81 Mg Tbec (Aspirin) .Marland Kitchen... Take 1 tablet by mouth once a day  Problem # 5:  ACID REFLUX DISEASE (ICD-530.81)  Her updated medication list for this problem includes:    Omeprazole 40 Mg Cpdr (Omeprazole) .Marland Kitchen... 1 tab by mouth daily, take 30 min before a meal  Patient Instructions: 1)  Your physician recommends that you schedule a follow-up appointment in: AS NEEDED PENDING TEST RESULTS 2)  Your physician has requested that you have a stress echocardiogram. For further information please visit [email or web address]  Please follow instruction sheet as given.

## 2010-11-14 NOTE — Letter (Signed)
Summary: Beather Arbour MD  Beather Arbour MD   Imported By: Lanelle Bal 04/28/2010 14:22:34  _____________________________________________________________________  External Attachment:    Type:   Image     Comment:   External Document

## 2010-11-16 NOTE — Progress Notes (Signed)
Summary: Medication Change  Phone Note Call from Patient Call back at [phone]   Caller: Patient Call For: Lemont Fillers FNP Summary of Call: patient called  and left voice message wanting to know if she could switch from Omeprazole to Nexium. Initial call taken by: Glendell Docker CMA,  October 27, 2010 1:20 PM  Follow-up for Phone Call        Please call patient and let her know that it is ok to switch from omeprazole to nexium.  I have sent a 1 month supply to her pharmacy.  She is overdue for f/u- was due back in September.   Please schedule follow up apt when you speak with patient.   Follow-up by: Lemont Fillers FNP,  October 30, 2010 8:14 AM  Additional Follow-up for Phone Call Additional follow up Details #1::        Left message on machine for pt to return my call.  Nicki Guadalajara Fergerson CMA Duncan Dull)  October 30, 2010 8:49 AM     Additional Follow-up for Phone Call Additional follow up Details #2::    Pt returned my call and was notified per [facility] instruction. Pt scheduled f/u for 11/07/10 @ 8:15am. Mervin Kung CMA (AAMA)  October 30, 2010 9:29 AM   New/Updated Medications: NEXIUM 40 MG CPDR (ESOMEPRAZOLE MAGNESIUM) one cap by mouth daily Prescriptions: NEXIUM 40 MG CPDR (ESOMEPRAZOLE MAGNESIUM) one cap by mouth daily  #30 x 0   Entered and Authorized by:   Lemont Fillers FNP   Signed by:   Lemont Fillers FNP on 10/30/2010   Method used:   Electronically to        [facility] (retail)       [address]       [place], [place]  [address]       Ph: [phone] or [phone]       Fax: [phone]   RxID:   [id number]

## 2010-11-16 NOTE — Assessment & Plan Note (Signed)
Summary: follow up / tf,cma--rm 5   Vital Signs:  Patient profile:   55 year old female Height:      67.6 inches Weight:      180 pounds BMI:     27.79 Temp:     98.1 degrees F 0 Pulse rate:   78 / minute Pulse rhythm:   regular Resp:     16 per minute BP sitting:   116 / 82  (right arm) Cuff size:   large  Vitals Entered By: Mervin Kung CMA Duncan Dull) (November 07, 2010 8:05 AM) CC: Pt here for follow up of depression., Depression Is Patient Diabetic? No Pain Assessment Patient in pain? no      Comments Pt states nexium is too expensive. Wants generic alternative. Stopped Celexa; was making life changes and feels she no longer needs it. Nicki Guadalajara Fergerson CMA Duncan Dull)  November 07, 2010 8:13 AM    Primary Care Provider:  Lemont Fillers FNP  CC:  Pt here for follow up of depression. and Depression.  History of Present Illness: Ms.  Clouatre is a 55 year old female who presents today for routine follow up.  1) Depression-  has been in counseling x 9 months with her husband.  Took citalopram briefly x 3 weeks. No real improvement during that time. Decided to stop it on her own,  "I don't like the idea of being on so many medications." Has started exercise.  Mood is improved.  Feels less depressed.  Alprazolam- using only twice a month.    2) GERD-  has made dietary changes.  Nexium was too expensive.  Has been taking omeprazole mg PO daily.  Burning has gotten worse.    3) OSA- using CPap sees Dr. Vassie Loll  Depression History:      The patient denies insomnia and hypersomnia.         Allergies: 1)  ! Codeine  Past History:  Past Medical History: Last updated: 03/29/2010 HRT since 2003 G2P2 menopause in 2006 ANXIETY  SLEEP APNEA DEPRESSION GERD  Past Surgical History: Last updated: 02/08/2009 none  Review of Systems       see HPI  Physical Exam  General:  Well-developed,well-nourished,in no acute distress; alert,appropriate and cooperative throughout  examination Head:  Normocephalic and atraumatic without obvious abnormalities. No apparent alopecia or balding. Lungs:  Normal respiratory effort, chest expands symmetrically. Lungs are clear to auscultation, no crackles or wheezes. Heart:  Normal rate and regular rhythm. S1 and S2 normal without gallop, murmur, click, rub or other extra sounds. Psych:  Cognition and judgment appear intact. Alert and cooperative with normal attention span and concentration. No apparent delusions, illusions, hallucinations   Impression & Recommendations:  Problem # 1:  DEPRESSION (ICD-311) Assessment Improved Symptoms appear stable off of citalopram.  Continue therapy as this seems to be helping. 15 minutes spent with patient.  Greater than 50% of this time was spent counseling patient on her depression and anxiety. The following medications were removed from the medication list:    Celexa 10 Mg Tabs (Citalopram hydrobromide) ..... One tablet by mouth daily Her updated medication list for this problem includes:    Alprazolam 0.5 Mg Tabs (Alprazolam) .Marland Kitchen... Take one tablet daily as needed.  Problem # 2:  ACID REFLUX DISEASE (ICD-530.81) Assessment: Deteriorated Trial of zegerid. Her updated medication list for this problem includes:    Zegerid Otc 20-1100 Mg Caps (Omeprazole-sodium bicarbonate) ..... One tablet by mouth daily  Problem # 3:  SLEEP APNEA,  OBSTRUCTIVE (ICD-327.23) Assessment: Comment Only following with Dr. Vassie Loll, has been tolerating CPAP  Complete Medication List: 1)  Vivelle-dot 0.0375 Mg/24hr Pttw (Estradiol) .... Twice weekly 2)  Prometrium 200 Mg Caps (Progesterone micronized) .... Every 3 months 3)  Zegerid Otc 20-1100 Mg Caps (Omeprazole-sodium bicarbonate) .... One tablet by mouth daily 4)  Alprazolam 0.5 Mg Tabs (Alprazolam) .... Take one tablet daily as needed. 5)  Premarin 0.625 Mg/gm Crea (Estrogens, conjugated) .... 2-3 times weekly 6)  Centrum Silver Tabs (Multiple  vitamins-minerals) .... Take 1 tablet by mouth once a day 7)  Fish Oil Double Strength 1200 Mg Caps (Omega-3 fatty acids) .... Take 1 capsule by mouth once a day 8)  Vitamin D3 1000 Unit Caps (Cholecalciferol) .... Take 2 caps daily 9)  Calcium Citrate 1000 Mg Tabs (calcium Citrate)  .... Take 2 twice a day 10)  Aspir-low 81 Mg Tbec (Aspirin) .... Take 1 tablet by mouth once a day  Patient Instructions: 1)  Please follow up in May for your annual physical. 2)  Come fasting to this appointment. 3)  Call if your reflux symptoms do not improve with the zegerid.   Orders Added: 1)  Est. Patient Level III [14782]    Current Allergies (reviewed today): ! CODEINE

## 2011-02-27 NOTE — Procedures (Signed)
Angel Arellano, Angel Arellano                ACCOUNT NO.:  [id number]   MEDICAL RECORD NO.:  [id number]          PATIENT TYPE:  OUT   LOCATION:  SLEEP CENTER                 FACILITY[facility]  [facility]   PHYSICIAN:  Clinton D. Maple Hudson, MD, FCCP, FACPDATE OF BIRTH:  12/30/1956   DATE OF STUDY:  03/12/2007                            NOCTURNAL POLYSOMNOGRAM   REFERRING PHYSICIAN:  Stacie Acres. Cliffton Asters, M.D.   INDICATION FOR STUDY:  Hypersomnia with sleep apnea.   EPWORTH SLEEPINESS SCORE:  10-24.  BMI 27.2, weight 174 pounds.   MEDICATIONS:  No medications are listed.   A previous diagnostic study on 12/11/2004 had recorded an AHI of 12.4/Hr.  CPAP titration is now requested.   SLEEP ARCHITECTURE:  Total sleep time 293 minutes with sleep efficiency  77%.  Stage I was 6%, stage II 64%, stages III and IV 9%, REM 21% of  total sleep time. Sleep latency 32 minutes, REM latency 177 minutes,  awake after sleep onset 58 minutes, arousal index 5.5.  Tylenol sleep  aide was taken at 8:40 p.m.   RESPIRATORY DATA:  CPAP titration protocol.  CPAP was titrated to 7 CWP,  AHI/Hr. An extra small Mirage Quattro full face mask was used with  heated humidifier.   OXYGEN DATA:  Mild snoring before CPAP control.  Oxygen saturation  wearing CPAP was 97% on room air.   CARDIAC DATA:  Normal sinus rhythm.   MOVEMENT-PARASOMNIA:  No significant movement or sleep disturbance.  Technician commented that patient was anxious to get test over.  No  bathroom trips.   IMPRESSIONS-RECOMMENDATIONS:  1. Successful CPAP titration to 7 CWP, AHI 0/Hr.  An extra small      Mirage Quattro mask was used with heated humidifier.  2. Previous diagnostic NPSG on 12/11/2004 had recorded an AHI of      12.4/Hr.     Clinton D. Maple Hudson, MD, [facility], FACP  Diplomate, Biomedical engineer of Sleep Medicine  Electronically Signed    CDY/MEDQ  D:  03/22/2007 12:08:21  T:  03/22/2007 20:14:52  Job:  [id number]

## 2011-03-02 NOTE — Procedures (Signed)
Angel Arellano, Angel Arellano                ACCOUNT NO.:  [id number]   MEDICAL RECORD NO.:  [id number]          PATIENT TYPE:  OUT   LOCATION:  SLEEP CENTER                 FACILITY:  [facility]   PHYSICIAN:  Clinton D. Maple Hudson, M.D. DATE OF BIRTH:  [birth year]   DATE OF STUDY:  12/11/2004                              NOCTURNAL POLYSOMNOGRAM   REFERRING PHYSICIAN:  Laurann Montana, MD   INDICATIONS FOR STUDY:  Hypersomnia with sleep apnea. Epworth sleepiness  score 5/24, BMI 27, weight 170 pounds.   SLEEP ARCHITECTURE:  Total sleep time 393 minutes with sleep efficiency of  90%. Stage I was 8%, stage II was 47%, stages III and IV were 23%, REM was  22% of total sleep time. Latency to sleep onset 17 minutes. Latency to REM  86 minutes. Awake after sleep onset 30 minutes. Arousal index 21.5.  No  medications were taken.   RESPIRATORY DATA:  Respiratory disturbance index (RDI) 12.4 obstructive  events per hour indicating mild or mild to moderate obstructive sleep  apnea/hypopnea syndrome. This included 22 obstructive apneas and 59  hypopneas. The events were not positional. CPAP titration could not be done  by split study protocol, but most events were clustered later in the night  following insufficient time for titration.   OXYGEN DATA:  Mild to moderate snoring with oxygen desaturation to a nadir  of 84% on room air. Mean oxygen saturation was 97% to 98% on room air  through the study.   CARDIAC DATA:  Normal sinus rhythm.   MOVEMENT/PARASOMNIA:  Occasional leg jerk with insignificant impact on  sleep.   IMPRESSION/RECOMMENDATIONS:  1.  Mild to moderate obstructive sleep apnea/hypopnea syndrome, RDI 12.4      hour with oxygen desaturation to 84%.  2.  Consider return for CPAP titration or evaluate for alternative therapies      as appropriate.      CDY/MEDQ  D:  12/17/2004 08:50:00  T:  12/17/2004 09:44:51  Job:  [id number]

## 2011-03-26 ENCOUNTER — Other Ambulatory Visit: Payer: Self-pay | Admitting: Gynecology

## 2011-04-02 ENCOUNTER — Other Ambulatory Visit: Payer: Self-pay | Admitting: Gynecology

## 2011-04-02 DIAGNOSIS — R922 Inconclusive mammogram: Secondary | ICD-10-CM

## 2011-04-16 ENCOUNTER — Emergency Department (HOSPITAL_COMMUNITY): Payer: 59

## 2011-04-16 ENCOUNTER — Emergency Department (HOSPITAL_COMMUNITY)
Admission: EM | Admit: 2011-04-16 | Discharge: 2011-04-17 | Disposition: A | Discharge status: Home / Self Care | Payer: 59 | Attending: Emergency Medicine | Admitting: Emergency Medicine

## 2011-04-16 DIAGNOSIS — R079 Chest pain, unspecified: Secondary | ICD-10-CM | POA: Insufficient documentation

## 2011-04-16 DIAGNOSIS — R0989 Other specified symptoms and signs involving the circulatory and respiratory systems: Secondary | ICD-10-CM | POA: Insufficient documentation

## 2011-04-16 DIAGNOSIS — R0609 Other forms of dyspnea: Secondary | ICD-10-CM | POA: Insufficient documentation

## 2011-04-16 LAB — CBC
HCT: 36 % (ref 36.0–46.0)
MCH: 30.1 pg (ref 26.0–34.0)
MCHC: 34.2 g/dL (ref 30.0–36.0)
MCV: 88.2 fL (ref 78.0–100.0)
RBC: 4.08 MIL/uL (ref 3.87–5.11)
WBC: 7.4 10*3/uL (ref 4.0–10.5)

## 2011-04-17 ENCOUNTER — Encounter: Payer: Self-pay | Admitting: Family

## 2011-04-17 LAB — COMPREHENSIVE METABOLIC PANEL
AST: 17 U/L (ref 0–37)
Albumin: 3.6 g/dL (ref 3.5–5.2)
Alkaline Phosphatase: 72 U/L (ref 39–117)
BUN: 11 mg/dL (ref 6–23)
Calcium: 8.8 mg/dL (ref 8.4–10.5)
Creatinine, Ser: 0.75 mg/dL (ref 0.50–1.10)
GFR calc Af Amer: >60 mL/min (ref >60)
GFR calc non Af Amer: >60 mL/min (ref >60)
Sodium: 139 mEq/L (ref 135–145)
Total Bilirubin: 0.3 mg/dL (ref 0.3–1.2)

## 2011-04-17 LAB — TROPONIN I: Troponin I: <0.3 ng/mL (ref <0.30)

## 2011-04-17 LAB — D-DIMER, QUANTITATIVE: D-Dimer, Quant: <0.22 ug/mL-FEU (ref 0.00–0.48)

## 2011-04-25 ENCOUNTER — Ambulatory Visit (INDEPENDENT_AMBULATORY_CARE_PROVIDER_SITE_OTHER): Payer: 59 | Admitting: Family

## 2011-04-25 ENCOUNTER — Encounter: Payer: Self-pay | Admitting: Family

## 2011-04-25 DIAGNOSIS — R0789 Other chest pain: Secondary | ICD-10-CM

## 2011-04-25 DIAGNOSIS — Z0000 Encounter for general adult medical examination without abnormal findings: Secondary | ICD-10-CM | POA: Insufficient documentation

## 2011-04-25 DIAGNOSIS — K219 Gastro-esophageal reflux disease without esophagitis: Secondary | ICD-10-CM

## 2011-04-25 DIAGNOSIS — F411 Generalized anxiety disorder: Secondary | ICD-10-CM

## 2011-04-25 DIAGNOSIS — F329 Major depressive disorder, single episode, unspecified: Secondary | ICD-10-CM

## 2011-04-25 DIAGNOSIS — R079 Chest pain, unspecified: Secondary | ICD-10-CM

## 2011-04-25 DIAGNOSIS — R7309 Other abnormal glucose: Secondary | ICD-10-CM

## 2011-04-25 DIAGNOSIS — R739 Hyperglycemia, unspecified: Secondary | ICD-10-CM

## 2011-04-25 DIAGNOSIS — G4733 Obstructive sleep apnea (adult) (pediatric): Secondary | ICD-10-CM

## 2011-04-25 MED ORDER — OMEPRAZOLE MAGNESIUM 20 MG PO TBEC: 20.0000 mg | DELAYED_RELEASE_TABLET | Freq: Every day | ORAL | Status: AC

## 2011-04-25 NOTE — Progress Notes (Signed)
Subjective:    Patient ID: Angel Arellano, female    DOB: [birth year], 55 y.o.   MRN: [id number]  HPI  Preventative- Not exercising regularly.  Gets up at 5:30AM.  Diet is good. She has gained some weight over the last 4-5 yrs.  Colo is up to date.    Chest pain- reports several month hx of chest pain located on the right upper chest.  She had some sharp chest pains with left arm heaviness and went to the [facility] ED and w/u was told to f/u with primary care.  Denies further chest pain since she was hospitalized in the ED.  She wonders if her discomfort is due to gallbladder  Depression/anxiety- reports that Dr. Nicholas Lose just started her on Zoloft 2 weeks ago.   Also give testosterone cream.  Took citalopram briefly last year. She reports that she has been resistant to taking antidepressant, but "I know I need to do something." She notes recent increased stress with her job, she feels very fatigued, she is tearful.  Sleeping better recently.    GERD-  Notes that she stopped PPI- still has symptoms from time to time.  Notes that she has been using pepcid complete on a prn basis.   Obstructive sleep apnea-she reports that she uses briefly for a few months but felt that it made her mouth dry. She reports a machine does have a humidification system. She notes that she is very tired during the day.  Review of Systems  Constitutional: Negative for fever.  HENT: Negative for hearing loss.   Eyes: Negative for visual disturbance.  Cardiovascular: Positive for palpitations. Negative for chest pain.       Occasional palpitations  Gastrointestinal: Negative for nausea, vomiting, diarrhea and blood in stool.  Genitourinary: Negative for dysuria and frequency.  Musculoskeletal: Negative for myalgias and arthralgias.  Skin: Negative for rash.  Neurological: Negative for headaches.  Hematological: Negative for adenopathy.  Psychiatric/Behavioral:       Denies panic attacks.  Using xanax several times a week    Past Medical History  Diagnosis Date  . Postmenopausal HRT (hormone replacement therapy) 2003    Menopause in 2006  . Anxiety   . Depression   . GERD (gastroesophageal reflux disease)   . Sleep apnea     History   Social History  . Marital Status: Married    Spouse Name: N/A    Number of Children: 2  . Years of Education: N/A   Occupational History  . CLERICAL    Social History Main Topics  . Smoking status: Never Smoker   . Smokeless tobacco: Never Used  . Alcohol Use: Yes     Several glasses wine/beer per week  . Drug Use: Not on file  . Sexually Active: Not on file   Other Topics Concern  . Not on file   Social History Narrative   No exercise. Fair diet. Trying to lose weight.    No past surgical history on file.  Family History  Problem Relation Age of Onset  . Seizures Mother     Epilepsy  . Dementia Mother   . Cancer Father     lung  . Diabetes Father   . Hypertension Father   . Cancer Sister     skin cancer  . Heart disease Neg Hx     Allergies  Allergen Reactions  . Codeine     REACTION: nausea/vomiting    Current Outpatient Prescriptions on File Prior to Visit  Medication Sig Dispense Refill  . ALPRAZolam (XANAX) 0.5 MG tablet Take 0.5 mg by mouth daily as needed.        . Calcium Carbonate (CALCIUM 500 PO) Take by mouth.        . Cholecalciferol (VITAMIN D) 1000 UNITS capsule Take 2 capsules daily.       Marland Kitchen conjugated estrogens (PREMARIN) vaginal cream Apply 2-3 times a week       . Multiple Vitamins-Minerals (CENTRUM SILVER PO) Take 1 tablet by mouth daily.        . Omega-3 Fatty Acids (FISH OIL) 1200 MG CAPS Take 1 capsule by mouth daily.        . progesterone (PROMETRIUM) 200 MG capsule Every 3 months         BP 100/78  Pulse 78  Temp(Src) 97.8 F (36.6 C) (Oral)  Resp 16  Wt 178 lb 1.3 oz (80.777 kg)         Objective:   Physical Exam  Constitutional: She appears well-developed and well-nourished.  HENT:  Head:  Normocephalic.  Right Ear: Tympanic membrane normal.  Left Ear: Tympanic membrane normal.  Mouth/Throat: Uvula is midline, oropharynx is clear and moist and mucous membranes are normal.  Eyes: Pupils are equal, round, and reactive to light.  Neck: Normal range of motion. Neck supple.  Cardiovascular: Normal rate and regular rhythm.   Pulmonary/Chest: Effort normal and breath sounds normal.  Abdominal: Soft. Bowel sounds are normal.  Genitourinary:       Deferred to GYN  Musculoskeletal: Normal range of motion.  Skin: Skin is warm and dry.  Psychiatric: Judgment and thought content normal.       Patient became tearful at times during interview          Assessment & Plan:

## 2011-04-25 NOTE — Assessment & Plan Note (Signed)
Stable with when necessary alprazolam.

## 2011-04-25 NOTE — Assessment & Plan Note (Addendum)
Deteriorated. I recommended that she resume prilosec OTC.

## 2011-04-25 NOTE — Assessment & Plan Note (Signed)
Patient had negative workup in the ED. Chest pain is resolved. She reports that she had a stress test performed last year over at the level of our [address]. Results are unavailable to me at this time, we are test results from the office.

## 2011-04-25 NOTE — Assessment & Plan Note (Signed)
Patient was counseled on diet, exercise, and weight loss. Health maintenance protocols were reviewed and up-to-date.

## 2011-04-25 NOTE — Assessment & Plan Note (Signed)
Deteriorated. She has a prescription for Zoloft which was given to her by her gynecologist. I have advised her to start this medication. She will follow up with Dr. Nicholas Lose.

## 2011-04-25 NOTE — Patient Instructions (Signed)
Complete lab work on the first floor. Try to exercise 20 minutes a day. Follow up in 6 months, sooner if problems or concerns.

## 2011-04-25 NOTE — Assessment & Plan Note (Signed)
I have advised her to resume use of her CPAP machine, as I suspect that this may be contributing to some of her overall fatigue.

## 2011-04-26 ENCOUNTER — Telehealth: Payer: Self-pay | Admitting: Family

## 2011-04-26 ENCOUNTER — Ambulatory Visit (HOSPITAL_BASED_OUTPATIENT_CLINIC_OR_DEPARTMENT_OTHER)
Admission: RE | Admit: 2011-04-26 | Discharge: 2011-04-26 | Disposition: A | Discharge status: Home / Self Care | Payer: 59 | Source: Ambulatory Visit | Attending: Family | Admitting: Family

## 2011-04-26 DIAGNOSIS — R079 Chest pain, unspecified: Secondary | ICD-10-CM | POA: Insufficient documentation

## 2011-04-26 DIAGNOSIS — R1013 Epigastric pain: Secondary | ICD-10-CM | POA: Insufficient documentation

## 2011-04-26 DIAGNOSIS — M549 Dorsalgia, unspecified: Secondary | ICD-10-CM | POA: Insufficient documentation

## 2011-04-26 DIAGNOSIS — K7689 Other specified diseases of liver: Secondary | ICD-10-CM | POA: Insufficient documentation

## 2011-04-26 NOTE — Telephone Encounter (Signed)
Pls call patient and let her know that her a1c (diabetes test) is normal. Thyroid is normal.  Gallbladder looks normal.  It shows that she has a fatty liver.  She should work hard on a low fat diet, exercise and weight loss to help this.

## 2011-04-26 NOTE — Telephone Encounter (Signed)
Left message with pt's husband to have pt return my call. 

## 2011-04-27 ENCOUNTER — Telehealth: Payer: Self-pay | Admitting: Cardiology

## 2011-04-27 ENCOUNTER — Telehealth: Payer: Self-pay | Admitting: *Deleted

## 2011-04-27 NOTE — Telephone Encounter (Signed)
Pt advised.

## 2011-04-27 NOTE — Telephone Encounter (Signed)
Message copied by Kathi Simpers on Fri Apr 27, 2011  9:25 AM ------      Message from: O'SULLIVAN, MELISSA      Created: Wed Apr 25, 2011 11:28 AM       Please call [place] [facility] and request copy of her stress test from last year.

## 2011-04-27 NOTE — Telephone Encounter (Signed)
Pt notified. States that she has allergies and symptoms usually start with itchy, watery eyes and face will feel scratchy. She uses allergy eye drops to help with this but wanted to know if she could try Singulair. Her daughter takes this for her allergies.  Has not tried other OTC allergy meds. Please advise.

## 2011-04-27 NOTE — Telephone Encounter (Signed)
Singulair is indicated for Allergic rhinitis and asthma.  I think that she would get more relief from her symptoms from OTC claritin or zyrtec.  I recommend that she try one of those medications.

## 2011-04-27 NOTE — Telephone Encounter (Signed)
Faxed Stress Echo to [facility] in [place] ([phone]).

## 2011-04-27 NOTE — Telephone Encounter (Signed)
Spoke to [place] at [phone] and requested result.

## 2011-04-30 NOTE — Telephone Encounter (Signed)
Record received and forwarded to Provider for review.

## 2011-05-10 ENCOUNTER — Ambulatory Visit
Admission: RE | Admit: 2011-05-10 | Discharge: 2011-05-10 | Disposition: A | Discharge status: Home / Self Care | Payer: 59 | Source: Ambulatory Visit | Attending: Gynecology | Admitting: Gynecology

## 2011-05-10 DIAGNOSIS — R922 Inconclusive mammogram: Secondary | ICD-10-CM

## 2011-10-23 ENCOUNTER — Other Ambulatory Visit: Payer: Self-pay | Admitting: Gynecology

## 2011-10-23 DIAGNOSIS — Z1231 Encounter for screening mammogram for malignant neoplasm of breast: Secondary | ICD-10-CM

## 2011-11-13 ENCOUNTER — Ambulatory Visit
Admission: RE | Admit: 2011-11-13 | Discharge: 2011-11-13 | Disposition: A | Discharge status: Home / Self Care | Payer: 59 | Source: Ambulatory Visit | Attending: Gynecology | Admitting: Gynecology

## 2011-11-13 DIAGNOSIS — Z1231 Encounter for screening mammogram for malignant neoplasm of breast: Secondary | ICD-10-CM

## 2011-11-21 ENCOUNTER — Other Ambulatory Visit: Payer: Self-pay | Admitting: Gynecology

## 2012-05-22 ENCOUNTER — Other Ambulatory Visit: Payer: Self-pay | Admitting: Gynecology

## 2012-10-29 ENCOUNTER — Other Ambulatory Visit: Payer: Self-pay | Admitting: Gynecology

## 2012-10-29 DIAGNOSIS — Z1231 Encounter for screening mammogram for malignant neoplasm of breast: Secondary | ICD-10-CM

## 2012-11-20 ENCOUNTER — Ambulatory Visit
Admission: RE | Admit: 2012-11-20 | Discharge: 2012-11-20 | Disposition: A | Discharge status: Home / Self Care | Payer: 59 | Source: Ambulatory Visit | Attending: Gynecology | Admitting: Gynecology

## 2012-11-20 DIAGNOSIS — Z1231 Encounter for screening mammogram for malignant neoplasm of breast: Secondary | ICD-10-CM

## 2013-11-27 ENCOUNTER — Other Ambulatory Visit: Payer: Self-pay

## 2013-11-27 DIAGNOSIS — Z1231 Encounter for screening mammogram for malignant neoplasm of breast: Secondary | ICD-10-CM

## 2013-12-15 ENCOUNTER — Ambulatory Visit
Admission: RE | Admit: 2013-12-15 | Discharge: 2013-12-15 | Disposition: A | Discharge status: Home / Self Care | Payer: Self-pay | Source: Ambulatory Visit

## 2013-12-15 DIAGNOSIS — Z1231 Encounter for screening mammogram for malignant neoplasm of breast: Secondary | ICD-10-CM

## 2014-11-16 ENCOUNTER — Other Ambulatory Visit (HOSPITAL_COMMUNITY)
Admission: RE | Admit: 2014-11-16 | Discharge: 2014-11-16 | Disposition: A | Discharge status: Home / Self Care | Payer: 59 | Source: Ambulatory Visit | Attending: Family Medicine | Admitting: Family Medicine

## 2014-11-16 ENCOUNTER — Other Ambulatory Visit: Payer: Self-pay | Admitting: Family Medicine

## 2014-11-16 DIAGNOSIS — Z01419 Encounter for gynecological examination (general) (routine) without abnormal findings: Secondary | ICD-10-CM | POA: Diagnosis present

## 2014-11-18 LAB — CYTOLOGY - PAP

## 2014-11-23 ENCOUNTER — Other Ambulatory Visit: Payer: Self-pay

## 2014-11-23 DIAGNOSIS — Z1231 Encounter for screening mammogram for malignant neoplasm of breast: Secondary | ICD-10-CM

## 2014-12-20 ENCOUNTER — Ambulatory Visit
Admission: RE | Admit: 2014-12-20 | Discharge: 2014-12-20 | Disposition: A | Discharge status: Home / Self Care | Payer: 59 | Source: Ambulatory Visit

## 2014-12-20 DIAGNOSIS — Z1231 Encounter for screening mammogram for malignant neoplasm of breast: Secondary | ICD-10-CM

## 2015-05-17 ENCOUNTER — Other Ambulatory Visit: Payer: Self-pay | Admitting: Family Medicine

## 2015-05-17 DIAGNOSIS — Z136 Encounter for screening for cardiovascular disorders: Secondary | ICD-10-CM

## 2015-05-20 ENCOUNTER — Other Ambulatory Visit: Payer: 59

## 2015-05-23 ENCOUNTER — Ambulatory Visit
Admission: RE | Admit: 2015-05-23 | Discharge: 2015-05-23 | Disposition: A | Discharge status: Home / Self Care | Payer: No Typology Code available for payment source | Source: Ambulatory Visit | Attending: Family Medicine | Admitting: Family Medicine

## 2015-05-23 DIAGNOSIS — Z136 Encounter for screening for cardiovascular disorders: Secondary | ICD-10-CM

## 2015-09-22 ENCOUNTER — Encounter: Payer: Self-pay | Admitting: Gastroenterology

## 2015-11-17 ENCOUNTER — Other Ambulatory Visit: Payer: Self-pay

## 2015-11-17 DIAGNOSIS — Z1231 Encounter for screening mammogram for malignant neoplasm of breast: Secondary | ICD-10-CM

## 2015-12-23 ENCOUNTER — Ambulatory Visit
Admission: RE | Admit: 2015-12-23 | Discharge: 2015-12-23 | Disposition: A | Discharge status: Home / Self Care | Payer: 59 | Source: Ambulatory Visit

## 2015-12-23 DIAGNOSIS — Z1231 Encounter for screening mammogram for malignant neoplasm of breast: Secondary | ICD-10-CM

## 2016-03-09 ENCOUNTER — Other Ambulatory Visit: Payer: Self-pay | Admitting: Occupational Medicine

## 2016-03-09 ENCOUNTER — Ambulatory Visit: Payer: Self-pay

## 2016-03-09 DIAGNOSIS — M25512 Pain in left shoulder: Secondary | ICD-10-CM

## 2016-03-21 ENCOUNTER — Emergency Department (HOSPITAL_BASED_OUTPATIENT_CLINIC_OR_DEPARTMENT_OTHER)
Admission: EM | Admit: 2016-03-21 | Discharge: 2016-03-21 | Disposition: A | Discharge status: Home / Self Care | Payer: 59 | Attending: Emergency Medicine | Admitting: Emergency Medicine

## 2016-03-21 ENCOUNTER — Encounter (HOSPITAL_BASED_OUTPATIENT_CLINIC_OR_DEPARTMENT_OTHER): Payer: Self-pay

## 2016-03-21 DIAGNOSIS — F329 Major depressive disorder, single episode, unspecified: Secondary | ICD-10-CM | POA: Diagnosis not present

## 2016-03-21 DIAGNOSIS — Z7982 Long term (current) use of aspirin: Secondary | ICD-10-CM | POA: Insufficient documentation

## 2016-03-21 DIAGNOSIS — R1084 Generalized abdominal pain: Secondary | ICD-10-CM | POA: Diagnosis present

## 2016-03-21 DIAGNOSIS — K529 Noninfective gastroenteritis and colitis, unspecified: Secondary | ICD-10-CM

## 2016-03-21 LAB — COMPREHENSIVE METABOLIC PANEL
ALK PHOS: 73 U/L (ref 38–126)
ALT: 16 U/L (ref 14–54)
ANION GAP: 7 (ref 5–15)
AST: 22 U/L (ref 15–41)
Albumin: 4.3 g/dL (ref 3.5–5.0)
BILIRUBIN TOTAL: 0.7 mg/dL (ref 0.3–1.2)
BUN: 19 mg/dL (ref 6–20)
CALCIUM: 9 mg/dL (ref 8.9–10.3)
CO2: 25 mmol/L (ref 22–32)
Chloride: 106 mmol/L (ref 101–111)
Creatinine, Ser: 0.84 mg/dL (ref 0.44–1.00)
Glucose, Bld: 118 mg/dL — ABNORMAL HIGH (ref 65–99)
POTASSIUM: 3.8 mmol/L (ref 3.5–5.1)
Sodium: 138 mmol/L (ref 135–145)
TOTAL PROTEIN: 7.7 g/dL (ref 6.5–8.1)

## 2016-03-21 LAB — URINE MICROSCOPIC-ADD ON: RBC / HPF: NONE SEEN RBC/hpf (ref 0–5)

## 2016-03-21 LAB — CBC
HEMATOCRIT: 41.2 % (ref 36.0–46.0)
HEMOGLOBIN: 13.8 g/dL (ref 12.0–15.0)
MCH: 30 pg (ref 26.0–34.0)
MCHC: 33.5 g/dL (ref 30.0–36.0)
MCV: 89.6 fL (ref 78.0–100.0)
Platelets: 231 10*3/uL (ref 150–400)
RBC: 4.6 MIL/uL (ref 3.87–5.11)
RDW: 13.1 % (ref 11.5–15.5)
WBC: 11 10*3/uL — AB (ref 4.0–10.5)

## 2016-03-21 LAB — URINALYSIS, ROUTINE W REFLEX MICROSCOPIC
BILIRUBIN URINE: NEGATIVE
Glucose, UA: NEGATIVE mg/dL
Hgb urine dipstick: NEGATIVE
Ketones, ur: NEGATIVE mg/dL
NITRITE: NEGATIVE
Protein, ur: NEGATIVE mg/dL
SPECIFIC GRAVITY, URINE: 1.02 (ref 1.005–1.030)
pH: 6 (ref 5.0–8.0)

## 2016-03-21 LAB — LIPASE, BLOOD: Lipase: 26 U/L (ref 11–51)

## 2016-03-21 MED ORDER — DICYCLOMINE HCL 10 MG PO CAPS
20.0000 mg | ORAL_CAPSULE | Freq: Once | ORAL | Status: AC
  Administered 2016-03-21: 20 mg via ORAL
  Filled 2016-03-21: qty 2

## 2016-03-21 MED ORDER — RANITIDINE HCL 150 MG/10ML PO SYRP
300.0000 mg | ORAL_SOLUTION | Freq: Once | ORAL | Status: AC
  Administered 2016-03-21: 300 mg via ORAL
  Filled 2016-03-21: qty 20

## 2016-03-21 MED ORDER — SODIUM CHLORIDE 0.9 % IV BOLUS (SEPSIS)
1000.0000 mL | Freq: Once | INTRAVENOUS | Status: AC
  Administered 2016-03-21: 1000 mL via INTRAVENOUS

## 2016-03-21 MED ORDER — ONDANSETRON 4 MG PO TBDP: 4.0000 mg | ORAL_TABLET | Freq: Three times a day (TID) | ORAL | Status: AC | PRN

## 2016-03-21 MED ORDER — ONDANSETRON HCL 4 MG/2ML IJ SOLN
4.0000 mg | Freq: Once | INTRAMUSCULAR | Status: AC
Start: 2016-03-21 — End: 2016-03-21
  Administered 2016-03-21: 4 mg via INTRAVENOUS
  Filled 2016-03-21: qty 2

## 2016-03-21 NOTE — ED Provider Notes (Signed)
CSN: [id number]     Arrival date & time 03/21/16  0413 History   First MD Initiated Contact with Patient 03/21/16 [id number]     Chief Complaint  Patient presents with  . Emesis     (Consider location/radiation/quality/duration/timing/severity/associated sxs/prior Treatment) HPI  Angel Arellano is a 60 y.o. female with no sig PMH, here with diffuse abd pain, N/V/D since 1am.  Patient was asleep at the time and it woke her.  She is concerned because she was spraying chemicals outside in her yard and thinks ingesting some of it may have caused her symptoms.  She denies and coughing or SOB.  She has no chest pain.  She denies blood in the emesis or diarrhea.  She had a recent cholecystectomy in Feb of this year.  She denies fevers or eating poor foods.  She has no dysuria or hematuria.  There are no further complaints.  10 Systems reviewed and are negative for acute change except as noted in the HPI.    Past Medical History  Diagnosis Date  . Postmenopausal HRT (hormone replacement therapy) 2003    Menopause in 2006  . Anxiety   . Depression   . GERD (gastroesophageal reflux disease)   . Sleep apnea    Past Surgical History  Procedure Laterality Date  . Cholecystectomy     Family History  Problem Relation Age of Onset  . Seizures Mother     Epilepsy  . Dementia Mother   . Cancer Father     lung  . Diabetes Father   . Hypertension Father   . Cancer Sister     skin cancer  . Heart disease Neg Hx    Social History  Substance Use Topics  . Smoking status: Never Smoker   . Smokeless tobacco: Never Used  . Alcohol Use: Yes     Comment: Several glasses wine/beer per week   OB History    No data available     Review of Systems    Allergies  Codeine  Home Medications   Prior to Admission medications   Medication Sig Start Date End Date Taking? Authorizing Provider  ALPRAZolam Prudy Feeler(XANAX) 0.5 MG tablet Take 0.5 mg by mouth daily as needed.      Historical Provider, MD   aspirin 325 MG tablet Take 325 mg by mouth daily.      Historical Provider, MD  Calcium Carbonate (CALCIUM 500 PO) Take by mouth.      Historical Provider, MD  Cholecalciferol (VITAMIN D) 1000 UNITS capsule Take 2 capsules daily.     Historical Provider, MD  conjugated estrogens (PREMARIN) vaginal cream Apply 2-3 times a week     Historical Provider, MD  estradiol (VIVELLE-DOT) 0.075 MG/24HR Place 1 patch onto the skin 2 (two) times a week.      Historical Provider, MD  famotidine-calcium carbonate-magnesium hydroxide (PEPCID COMPLETE) 10-800-165 MG CHEW Chew 1 tablet by mouth daily as needed.      Historical Provider, MD  Multiple Vitamins-Minerals (CENTRUM SILVER PO) Take 1 tablet by mouth daily.      Historical Provider, MD  NON FORMULARY Apply 4 % topically daily. TESTOSTERONE CREAM.  Apply pea size amount daily.     Historical Provider, MD  Omega-3 Fatty Acids (FISH OIL) 1200 MG CAPS Take 1 capsule by mouth daily.      Historical Provider, MD  omeprazole (PRILOSEC OTC) 20 MG tablet Take 1 tablet (20 mg total) by mouth daily. 04/25/11 04/24/12  Sandford CrazeMelissa O'Sullivan, NP  progesterone (PROMETRIUM) 200 MG capsule Every 3 months     Historical Provider, MD  sertraline (ZOLOFT) 50 MG tablet Take 50 mg by mouth daily.      Historical Provider, MD   BP 130/77 mmHg  Pulse 72  Temp(Src) 98.8 F (37.1 C) (Oral)  Resp 18  SpO2 99% Physical Exam  Constitutional: She is oriented to person, place, and time. She appears well-developed and well-nourished. No distress.  HENT:  Head: Normocephalic and atraumatic.  Nose: Nose normal.  Mouth/Throat: No oropharyngeal exudate.  Dry oropharynx  Eyes: Conjunctivae and EOM are normal. Pupils are equal, round, and reactive to light. No scleral icterus.  Neck: Normal range of motion. Neck supple. No JVD present. No tracheal deviation present. No thyromegaly present.  Cardiovascular: Normal rate, regular rhythm and normal heart sounds.  Exam reveals no gallop and  no friction rub.   No murmur heard. Pulmonary/Chest: Effort normal and breath sounds normal. No respiratory distress. She has no wheezes. She exhibits no tenderness.  Abdominal: Soft. Bowel sounds are normal. She exhibits no distension and no mass. There is no tenderness. There is no rebound and no guarding.  Musculoskeletal: Normal range of motion. She exhibits no edema or tenderness.  Lymphadenopathy:    She has no cervical adenopathy.  Neurological: She is alert and oriented to person, place, and time. No cranial nerve deficit. She exhibits normal muscle tone.  Skin: Skin is warm and dry. No rash noted. No erythema. No pallor.  Nursing note and vitals reviewed.   ED Course  Procedures (including critical care time) Labs Review Labs Reviewed  COMPREHENSIVE METABOLIC PANEL - Abnormal; Notable for the following:    Glucose, Bld 118 (*)    All other components within normal limits  CBC - Abnormal; Notable for the following:    WBC 11.0 (*)    All other components within normal limits  URINALYSIS, ROUTINE W REFLEX MICROSCOPIC (NOT AT [facility]) - Abnormal; Notable for the following:    Leukocytes, UA SMALL (*)    All other components within normal limits  URINE MICROSCOPIC-ADD ON - Abnormal; Notable for the following:    Squamous Epithelial / LPF 0-5 (*)    Bacteria, UA RARE (*)    All other components within normal limits  LIPASE, BLOOD    Imaging Review No results found. I have personally reviewed and evaluated these images and lab results as part of my medical decision-making.   EKG Interpretation None      MDM   Final diagnoses:  None     Patient presents to the ED for diffuse abd pain, N/V/D.  Appears to be a gastroenteritis.  Labs are unremarkable.  Her mouth appears dry, she was given 2L IVF, zofran, ranitidine and bentyl for her symptoms.  Will continue to re-evaluate.   6:35 AM labs and urine studies unremarkable.  Patient had one more episode of emesis and  diarrhea in the ED but now states she thinks it is over and feels much better.  Will DC home with PRN zofran and PCP fu in 3 days.  She appears well and in NAD.  Abdominal exam remains benign.  VS remain within her normal limits and she is safe for DC    Tomasita Crumble, MD 03/21/16 [phone]

## 2016-03-21 NOTE — ED Notes (Signed)
Pt c/o n/v/d since 0115, states used poison oak spray out in the yard today for an hour, feels like she may have ingested it

## 2016-03-21 NOTE — Discharge Instructions (Signed)
Viral Gastroenteritis Angel Arellano, drink plenty of fluids to stay well hydrated as this runs its course.  Take zofran as needed for nausea.  See your primary care doctor within 3 days for close follow up. If symptoms worsen, come back to the ED immediately. Thank you. Viral gastroenteritis is also called stomach flu. This illness is caused by a certain type of germ (virus). It can cause sudden watery poop (diarrhea) and throwing up (vomiting). This can cause you to lose body fluids (dehydration). This illness usually lasts for 3 to 8 days. It usually goes away on its own. HOME CARE   Drink enough fluids to keep your pee (urine) clear or pale yellow. Drink small amounts of fluids often.  Ask your doctor how to replace body fluid losses (rehydration).  Avoid:  Foods high in sugar.  Alcohol.  Bubbly (carbonated) drinks.  Tobacco.  Juice.  Caffeine drinks.  Very hot or cold fluids.  Fatty, greasy foods.  Eating too much at one time.  Dairy products until 24 to 48 hours after your watery poop stops.  You may eat foods with active cultures (probiotics). They can be found in some yogurts and supplements.  Wash your hands well to avoid spreading the illness.  Only take medicines as told by your doctor. Do not give aspirin to children. Do not take medicines for watery poop (antidiarrheals).  Ask your doctor if you should keep taking your regular medicines.  Keep all doctor visits as told. GET HELP RIGHT AWAY IF:   You cannot keep fluids down.  You do not pee at least once every 6 to 8 hours.  You are short of breath.  You see blood in your poop or throw up. This may look like coffee grounds.  You have belly (abdominal) pain that gets worse or is just in one small spot (localized).  You keep throwing up or having watery poop.  You have a fever.  The patient is a child younger than 3 months, and he or she has a fever.  The patient is a child older than 3 months, and he or  she has a fever and problems that do not go away.  The patient is a child older than 3 months, and he or she has a fever and problems that suddenly get worse.  The patient is a baby, and he or she has no tears when crying. MAKE SURE YOU:   Understand these instructions.  Will watch your condition.  Will get help right away if you are not doing well or get worse.   This information is not intended to replace advice given to you by your health care provider. Make sure you discuss any questions you have with your health care provider.   Document Released: 03/19/2008 Document Revised: 12/24/2011 Document Reviewed: 07/18/2011 Elsevier Interactive Patient Education [employer]

## 2016-07-06 ENCOUNTER — Other Ambulatory Visit: Payer: Self-pay | Admitting: Family Medicine

## 2016-07-06 DIAGNOSIS — R911 Solitary pulmonary nodule: Secondary | ICD-10-CM

## 2016-07-12 ENCOUNTER — Ambulatory Visit
Admission: RE | Admit: 2016-07-12 | Discharge: 2016-07-12 | Disposition: A | Discharge status: Home / Self Care | Payer: 59 | Source: Ambulatory Visit | Attending: Family Medicine | Admitting: Family Medicine

## 2016-07-12 DIAGNOSIS — R911 Solitary pulmonary nodule: Secondary | ICD-10-CM

## 2016-11-20 ENCOUNTER — Other Ambulatory Visit: Payer: Self-pay | Admitting: Family Medicine

## 2016-11-20 DIAGNOSIS — N644 Mastodynia: Secondary | ICD-10-CM

## 2016-11-28 ENCOUNTER — Other Ambulatory Visit: Payer: Self-pay | Admitting: Family Medicine

## 2016-11-28 DIAGNOSIS — N644 Mastodynia: Secondary | ICD-10-CM

## 2016-11-28 DIAGNOSIS — N63 Unspecified lump in unspecified breast: Secondary | ICD-10-CM

## 2016-12-24 ENCOUNTER — Ambulatory Visit
Admission: RE | Admit: 2016-12-24 | Discharge: 2016-12-24 | Disposition: A | Discharge status: Home / Self Care | Payer: BLUE CROSS/BLUE SHIELD | Source: Ambulatory Visit | Attending: Family Medicine | Admitting: Family Medicine

## 2016-12-24 DIAGNOSIS — N63 Unspecified lump in unspecified breast: Secondary | ICD-10-CM

## 2016-12-24 DIAGNOSIS — N644 Mastodynia: Secondary | ICD-10-CM

## 2017-11-27 ENCOUNTER — Other Ambulatory Visit: Payer: Self-pay | Admitting: Family Medicine

## 2017-11-27 ENCOUNTER — Other Ambulatory Visit (HOSPITAL_COMMUNITY)
Admission: RE | Admit: 2017-11-27 | Discharge: 2017-11-27 | Disposition: A | Discharge status: Home / Self Care | Payer: BLUE CROSS/BLUE SHIELD | Source: Ambulatory Visit | Attending: Family Medicine | Admitting: Family Medicine

## 2017-11-27 DIAGNOSIS — Z0000 Encounter for general adult medical examination without abnormal findings: Secondary | ICD-10-CM | POA: Diagnosis not present

## 2017-11-27 DIAGNOSIS — K219 Gastro-esophageal reflux disease without esophagitis: Secondary | ICD-10-CM | POA: Diagnosis not present

## 2017-11-27 DIAGNOSIS — Z124 Encounter for screening for malignant neoplasm of cervix: Secondary | ICD-10-CM | POA: Diagnosis not present

## 2017-11-27 DIAGNOSIS — F325 Major depressive disorder, single episode, in full remission: Secondary | ICD-10-CM | POA: Diagnosis not present

## 2017-11-27 DIAGNOSIS — Z13220 Encounter for screening for lipoid disorders: Secondary | ICD-10-CM | POA: Diagnosis not present

## 2017-11-28 LAB — CYTOLOGY - PAP: Diagnosis: NEGATIVE

## 2017-12-03 ENCOUNTER — Other Ambulatory Visit: Payer: Self-pay | Admitting: Family Medicine

## 2017-12-05 ENCOUNTER — Other Ambulatory Visit: Payer: Self-pay | Admitting: Family Medicine

## 2017-12-05 DIAGNOSIS — Z1231 Encounter for screening mammogram for malignant neoplasm of breast: Secondary | ICD-10-CM

## 2018-01-28 ENCOUNTER — Ambulatory Visit: Payer: Self-pay

## 2018-05-15 ENCOUNTER — Ambulatory Visit
Admission: RE | Admit: 2018-05-15 | Discharge: 2018-05-15 | Disposition: A | Discharge status: Home / Self Care | Payer: BLUE CROSS/BLUE SHIELD | Source: Ambulatory Visit | Attending: Family Medicine | Admitting: Family Medicine

## 2018-05-15 DIAGNOSIS — Z1231 Encounter for screening mammogram for malignant neoplasm of breast: Secondary | ICD-10-CM | POA: Diagnosis not present

## 2018-12-10 DIAGNOSIS — Z13220 Encounter for screening for lipoid disorders: Secondary | ICD-10-CM | POA: Diagnosis not present

## 2018-12-10 DIAGNOSIS — I8393 Asymptomatic varicose veins of bilateral lower extremities: Secondary | ICD-10-CM | POA: Diagnosis not present

## 2018-12-10 DIAGNOSIS — K219 Gastro-esophageal reflux disease without esophagitis: Secondary | ICD-10-CM | POA: Diagnosis not present

## 2018-12-10 DIAGNOSIS — M79604 Pain in right leg: Secondary | ICD-10-CM | POA: Diagnosis not present

## 2018-12-10 DIAGNOSIS — Z0000 Encounter for general adult medical examination without abnormal findings: Secondary | ICD-10-CM | POA: Diagnosis not present

## 2019-06-03 ENCOUNTER — Other Ambulatory Visit: Payer: Self-pay | Admitting: Family Medicine

## 2019-06-03 DIAGNOSIS — Z1231 Encounter for screening mammogram for malignant neoplasm of breast: Secondary | ICD-10-CM

## 2019-06-23 ENCOUNTER — Ambulatory Visit
Admission: RE | Admit: 2019-06-23 | Discharge: 2019-06-23 | Disposition: A | Discharge status: Home / Self Care | Payer: BLUE CROSS/BLUE SHIELD | Source: Ambulatory Visit | Attending: Family Medicine | Admitting: Family Medicine

## 2019-06-23 ENCOUNTER — Other Ambulatory Visit: Payer: Self-pay

## 2019-06-23 DIAGNOSIS — Z1231 Encounter for screening mammogram for malignant neoplasm of breast: Secondary | ICD-10-CM | POA: Diagnosis not present

## 2020-01-06 DIAGNOSIS — I8393 Asymptomatic varicose veins of bilateral lower extremities: Secondary | ICD-10-CM | POA: Diagnosis not present

## 2020-01-06 DIAGNOSIS — Z13220 Encounter for screening for lipoid disorders: Secondary | ICD-10-CM | POA: Diagnosis not present

## 2020-01-06 DIAGNOSIS — Z0000 Encounter for general adult medical examination without abnormal findings: Secondary | ICD-10-CM | POA: Diagnosis not present

## 2020-01-06 DIAGNOSIS — M79604 Pain in right leg: Secondary | ICD-10-CM | POA: Diagnosis not present

## 2020-01-06 DIAGNOSIS — K219 Gastro-esophageal reflux disease without esophagitis: Secondary | ICD-10-CM | POA: Diagnosis not present

## 2020-01-06 DIAGNOSIS — F325 Major depressive disorder, single episode, in full remission: Secondary | ICD-10-CM | POA: Diagnosis not present

## 2020-02-16 DIAGNOSIS — Z7184 Encounter for health counseling related to travel: Secondary | ICD-10-CM | POA: Diagnosis not present

## 2020-02-19 DIAGNOSIS — Z20822 Contact with and (suspected) exposure to covid-19: Secondary | ICD-10-CM | POA: Diagnosis not present

## 2020-02-21 IMAGING — MG MM DIGITAL SCREENING BILAT W/ TOMO W/ CAD
8 series · 8 of 24 positions shown · non-contrast
Comparison: Previous exam(s).

CLINICAL DATA: Screening.

EXAM:
DIGITAL SCREENING BILATERAL MAMMOGRAM WITH TOMO AND CAD

[R CC synth-2D]
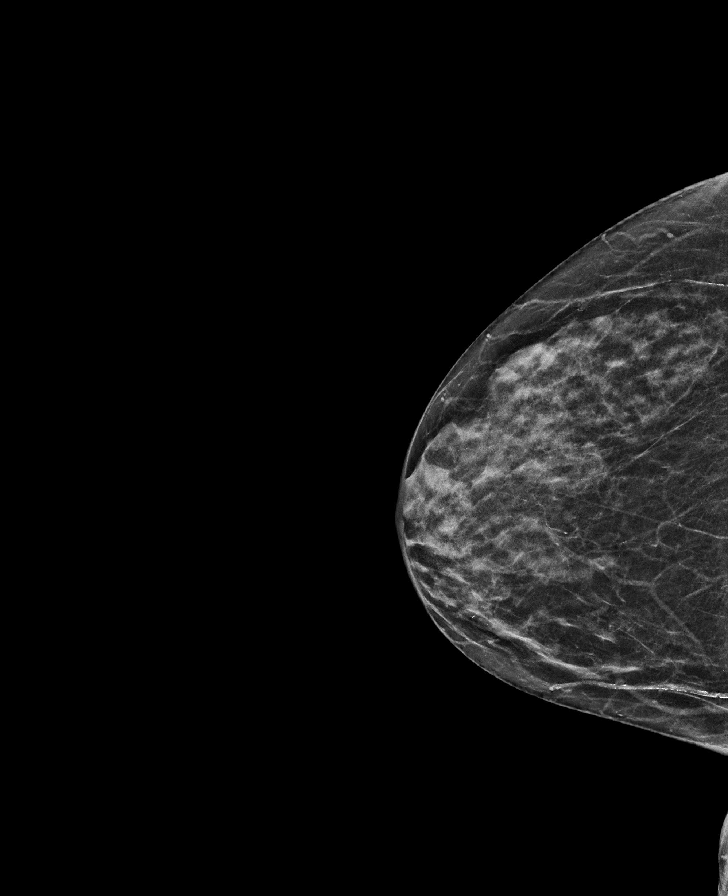

[R MLO synth-2D]
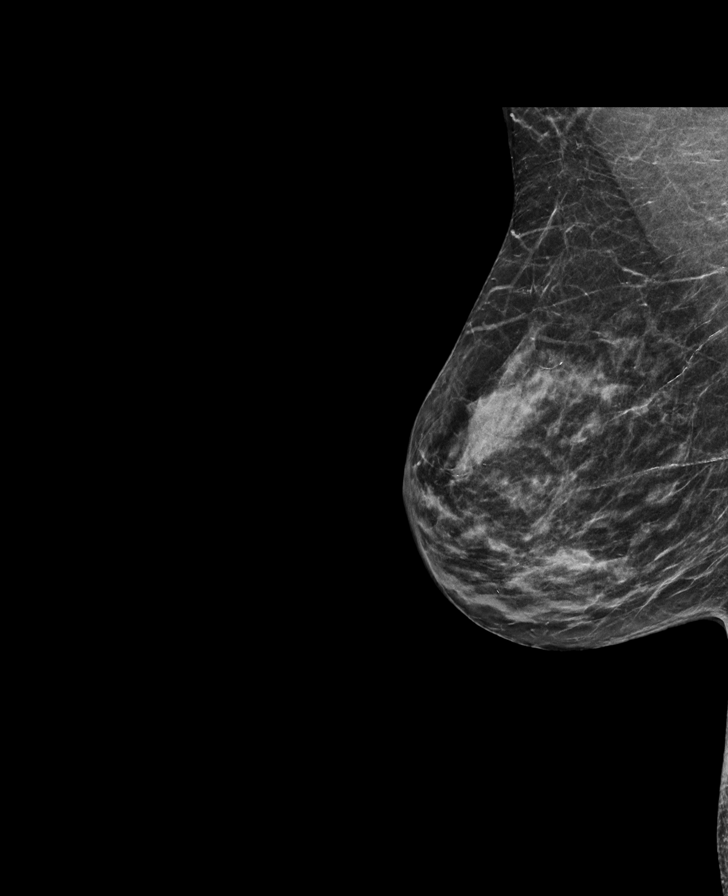

[L CC synth-2D]
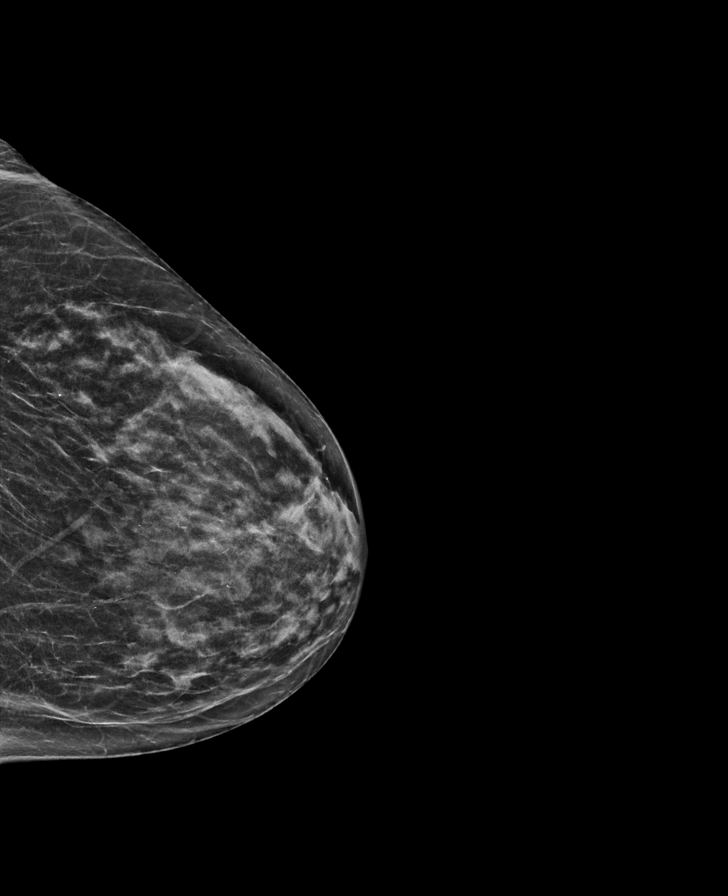

[L MLO synth-2D]
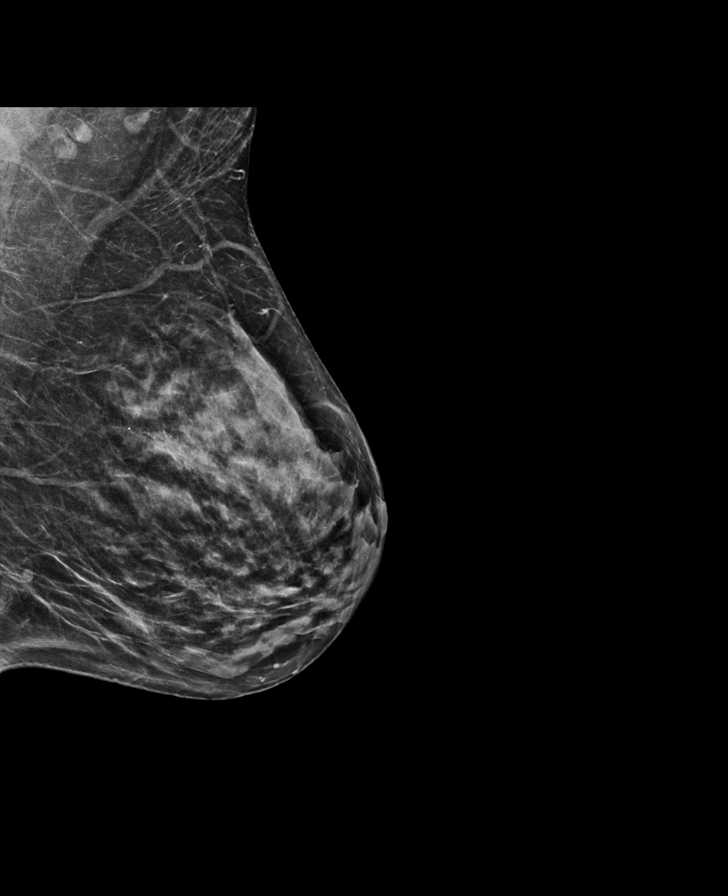

[R CC tomo · tomo slice 23/46.0]
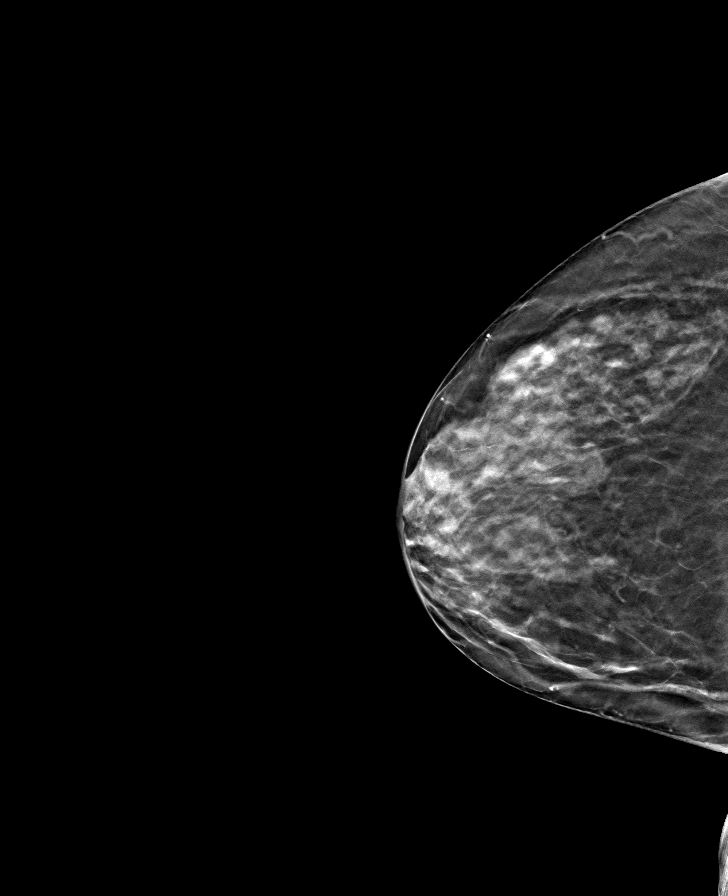

[L MLO tomo · tomo slice 29/58.0]
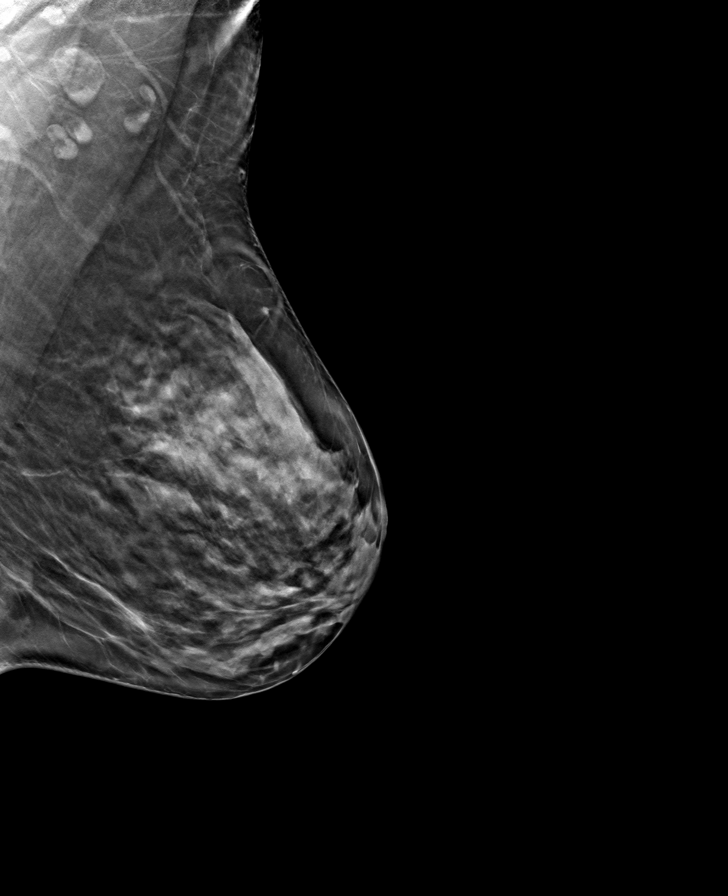

[L CC tomo · tomo slice 26/51.0]
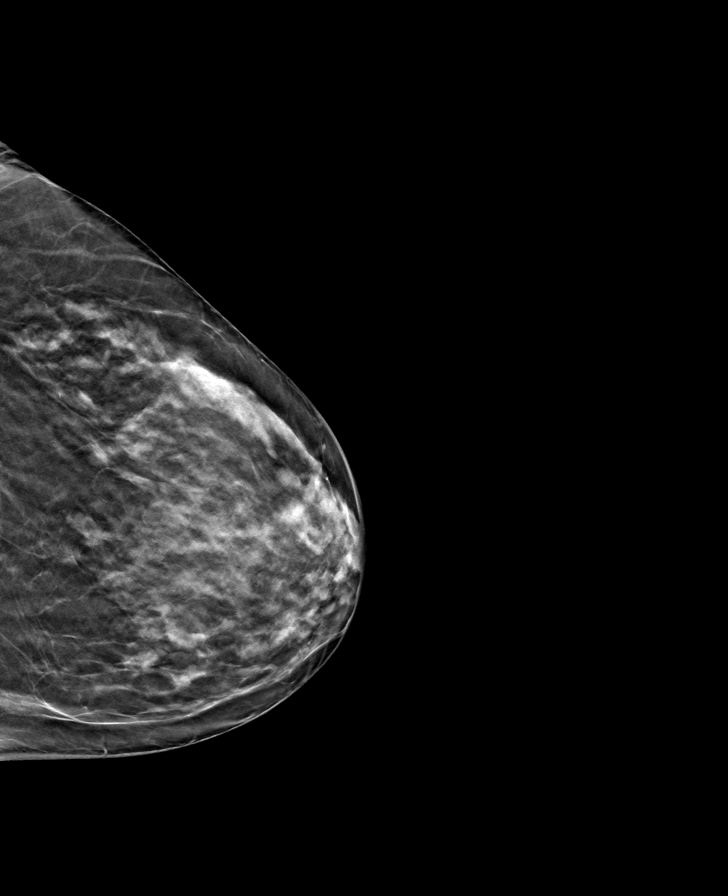

[R MLO tomo · tomo slice 27/54.0]
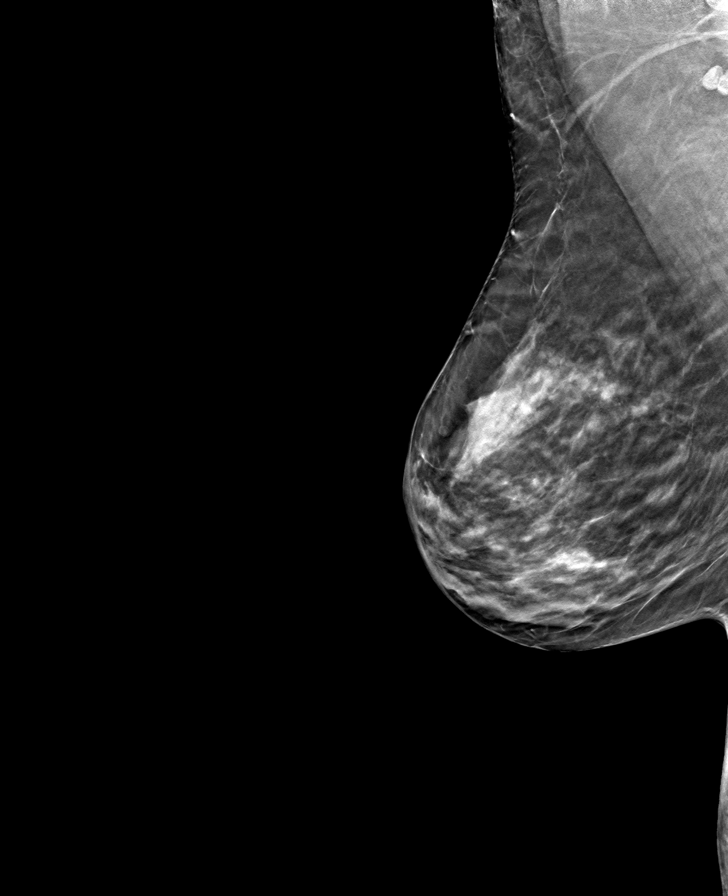

[8 of 24 positions shown; findings below may reference images not displayed]

ACR Breast Density Category c: The breast tissue is heterogeneously
dense, which may obscure small masses.
FINDINGS: There are no findings suspicious for malignancy. Images were
processed with CAD.
IMPRESSION: No mammographic evidence of malignancy. A result letter of this
screening mammogram will be mailed directly to the patient.

RECOMMENDATION:
Screening mammogram in one year. (Code:[id number]-[id number])

BI-RADS CATEGORY  1: Negative.

## 2020-07-02 DIAGNOSIS — Z1231 Encounter for screening mammogram for malignant neoplasm of breast: Secondary | ICD-10-CM | POA: Diagnosis not present

## 2020-07-11 DIAGNOSIS — Z1211 Encounter for screening for malignant neoplasm of colon: Secondary | ICD-10-CM | POA: Diagnosis not present

## 2020-07-11 DIAGNOSIS — D120 Benign neoplasm of cecum: Secondary | ICD-10-CM | POA: Diagnosis not present

## 2020-07-11 DIAGNOSIS — D124 Benign neoplasm of descending colon: Secondary | ICD-10-CM | POA: Diagnosis not present

## 2020-07-11 DIAGNOSIS — K635 Polyp of colon: Secondary | ICD-10-CM | POA: Diagnosis not present

## 2021-07-08 DIAGNOSIS — Z1231 Encounter for screening mammogram for malignant neoplasm of breast: Secondary | ICD-10-CM | POA: Diagnosis not present

## 2022-06-20 DIAGNOSIS — Z8639 Personal history of other endocrine, nutritional and metabolic disease: Secondary | ICD-10-CM | POA: Diagnosis not present

## 2022-06-20 DIAGNOSIS — K219 Gastro-esophageal reflux disease without esophagitis: Secondary | ICD-10-CM | POA: Diagnosis not present

## 2022-06-20 DIAGNOSIS — F324 Major depressive disorder, single episode, in partial remission: Secondary | ICD-10-CM | POA: Diagnosis not present

## 2022-06-20 DIAGNOSIS — E7800 Pure hypercholesterolemia, unspecified: Secondary | ICD-10-CM | POA: Diagnosis not present

## 2022-06-20 DIAGNOSIS — Z0000 Encounter for general adult medical examination without abnormal findings: Secondary | ICD-10-CM | POA: Diagnosis not present

## 2022-07-21 DIAGNOSIS — Z1231 Encounter for screening mammogram for malignant neoplasm of breast: Secondary | ICD-10-CM | POA: Diagnosis not present

## 2022-09-18 ENCOUNTER — Other Ambulatory Visit: Payer: Self-pay | Admitting: Family Medicine

## 2022-09-18 DIAGNOSIS — E2839 Other primary ovarian failure: Secondary | ICD-10-CM

## 2022-10-03 DIAGNOSIS — H5203 Hypermetropia, bilateral: Secondary | ICD-10-CM | POA: Diagnosis not present

## 2022-11-13 ENCOUNTER — Ambulatory Visit
Admission: RE | Admit: 2022-11-13 | Discharge: 2022-11-13 | Disposition: A | Discharge status: Home / Self Care | Payer: Medicare Other | Source: Ambulatory Visit | Attending: Family Medicine | Admitting: Family Medicine

## 2022-11-13 DIAGNOSIS — M85852 Other specified disorders of bone density and structure, left thigh: Secondary | ICD-10-CM | POA: Diagnosis not present

## 2022-11-13 DIAGNOSIS — Z780 Asymptomatic menopausal state: Secondary | ICD-10-CM | POA: Diagnosis not present

## 2022-11-13 DIAGNOSIS — E2839 Other primary ovarian failure: Secondary | ICD-10-CM

## 2022-11-20 DIAGNOSIS — H524 Presbyopia: Secondary | ICD-10-CM | POA: Diagnosis not present

## 2023-02-26 DIAGNOSIS — K080 Exfoliation of teeth due to systemic causes: Secondary | ICD-10-CM | POA: Diagnosis not present

## 2023-05-08 DIAGNOSIS — D2262 Melanocytic nevi of left upper limb, including shoulder: Secondary | ICD-10-CM | POA: Diagnosis not present

## 2023-05-08 DIAGNOSIS — D225 Melanocytic nevi of trunk: Secondary | ICD-10-CM | POA: Diagnosis not present

## 2023-05-08 DIAGNOSIS — Z808 Family history of malignant neoplasm of other organs or systems: Secondary | ICD-10-CM | POA: Diagnosis not present

## 2023-05-08 DIAGNOSIS — L821 Other seborrheic keratosis: Secondary | ICD-10-CM | POA: Diagnosis not present

## 2023-07-20 DIAGNOSIS — N390 Urinary tract infection, site not specified: Secondary | ICD-10-CM | POA: Diagnosis not present

## 2023-09-03 DIAGNOSIS — K080 Exfoliation of teeth due to systemic causes: Secondary | ICD-10-CM | POA: Diagnosis not present

## 2023-10-22 DIAGNOSIS — K080 Exfoliation of teeth due to systemic causes: Secondary | ICD-10-CM | POA: Diagnosis not present

## 2023-11-21 DIAGNOSIS — N644 Mastodynia: Secondary | ICD-10-CM | POA: Diagnosis not present

## 2023-11-21 DIAGNOSIS — R9230 Dense breasts, unspecified: Secondary | ICD-10-CM | POA: Diagnosis not present

## 2023-12-02 DIAGNOSIS — D225 Melanocytic nevi of trunk: Secondary | ICD-10-CM | POA: Diagnosis not present

## 2023-12-02 DIAGNOSIS — Z808 Family history of malignant neoplasm of other organs or systems: Secondary | ICD-10-CM | POA: Diagnosis not present

## 2023-12-02 DIAGNOSIS — L814 Other melanin hyperpigmentation: Secondary | ICD-10-CM | POA: Diagnosis not present

## 2023-12-02 DIAGNOSIS — L821 Other seborrheic keratosis: Secondary | ICD-10-CM | POA: Diagnosis not present

## 2024-03-04 DIAGNOSIS — Z860100 Personal history of colon polyps, unspecified: Secondary | ICD-10-CM | POA: Diagnosis not present

## 2024-03-04 DIAGNOSIS — Z8639 Personal history of other endocrine, nutritional and metabolic disease: Secondary | ICD-10-CM | POA: Diagnosis not present

## 2024-03-04 DIAGNOSIS — F3342 Major depressive disorder, recurrent, in full remission: Secondary | ICD-10-CM | POA: Diagnosis not present

## 2024-03-04 DIAGNOSIS — Z7689 Persons encountering health services in other specified circumstances: Secondary | ICD-10-CM | POA: Diagnosis not present

## 2024-03-04 DIAGNOSIS — Z1330 Encounter for screening examination for mental health and behavioral disorders, unspecified: Secondary | ICD-10-CM | POA: Diagnosis not present

## 2024-05-12 DIAGNOSIS — K080 Exfoliation of teeth due to systemic causes: Secondary | ICD-10-CM | POA: Diagnosis not present

## 2024-05-21 DIAGNOSIS — N644 Mastodynia: Secondary | ICD-10-CM | POA: Diagnosis not present

## 2024-05-21 DIAGNOSIS — R9230 Dense breasts, unspecified: Secondary | ICD-10-CM | POA: Diagnosis not present

## 2024-06-04 DIAGNOSIS — Z0000 Encounter for general adult medical examination without abnormal findings: Secondary | ICD-10-CM | POA: Diagnosis not present

## 2024-06-04 DIAGNOSIS — Z13220 Encounter for screening for lipoid disorders: Secondary | ICD-10-CM | POA: Diagnosis not present
